# Patient Record
Sex: Male | Born: 1970 | Race: Black or African American | Hispanic: No | Marital: Married | Smoking: Former smoker
Health system: Southern US, Community
[De-identification: ages and names within clinical notes are randomized; demographics above are authoritative.]

## PROBLEM LIST (undated history)

## (undated) ENCOUNTER — Ambulatory Visit (HOSPITAL_COMMUNITY): Admission: EM | Payer: Self-pay | Source: Home / Self Care

## (undated) DIAGNOSIS — I82409 Acute embolism and thrombosis of unspecified deep veins of unspecified lower extremity: Secondary | ICD-10-CM

---

## 2008-02-11 ENCOUNTER — Ambulatory Visit: Payer: Self-pay | Admitting: Internal Medicine

## 2008-02-11 ENCOUNTER — Ambulatory Visit: Payer: Self-pay | Admitting: Vascular Surgery

## 2008-02-11 ENCOUNTER — Inpatient Hospital Stay (HOSPITAL_COMMUNITY): Admission: EM | Admit: 2008-02-11 | Discharge: 2008-02-17 | Payer: Self-pay | Admitting: Emergency Medicine

## 2008-02-18 ENCOUNTER — Encounter: Payer: Self-pay | Admitting: Sports Medicine

## 2008-02-18 ENCOUNTER — Ambulatory Visit (HOSPITAL_COMMUNITY): Admission: RE | Admit: 2008-02-18 | Discharge: 2008-02-18 | Payer: Self-pay | Admitting: Sports Medicine

## 2008-02-18 ENCOUNTER — Ambulatory Visit: Payer: Self-pay | Admitting: Family Medicine

## 2008-02-18 DIAGNOSIS — I2699 Other pulmonary embolism without acute cor pulmonale: Secondary | ICD-10-CM

## 2008-02-18 DIAGNOSIS — I82419 Acute embolism and thrombosis of unspecified femoral vein: Secondary | ICD-10-CM | POA: Insufficient documentation

## 2008-02-18 DIAGNOSIS — I82409 Acute embolism and thrombosis of unspecified deep veins of unspecified lower extremity: Secondary | ICD-10-CM | POA: Insufficient documentation

## 2008-02-18 HISTORY — DX: Acute embolism and thrombosis of unspecified deep veins of unspecified lower extremity: I82.409

## 2008-02-18 HISTORY — DX: Other pulmonary embolism without acute cor pulmonale: I26.99

## 2008-02-20 ENCOUNTER — Telehealth: Payer: Self-pay | Admitting: Family Medicine

## 2008-02-20 ENCOUNTER — Emergency Department (HOSPITAL_COMMUNITY): Admission: EM | Admit: 2008-02-20 | Discharge: 2008-02-20 | Payer: Self-pay | Admitting: Emergency Medicine

## 2008-02-24 ENCOUNTER — Telehealth: Payer: Self-pay | Admitting: *Deleted

## 2008-02-24 ENCOUNTER — Ambulatory Visit: Payer: Self-pay | Admitting: Family Medicine

## 2008-02-24 DIAGNOSIS — M79609 Pain in unspecified limb: Secondary | ICD-10-CM

## 2008-02-24 DIAGNOSIS — G8929 Other chronic pain: Secondary | ICD-10-CM | POA: Insufficient documentation

## 2008-02-27 ENCOUNTER — Ambulatory Visit: Payer: Self-pay | Admitting: Family Medicine

## 2008-02-27 LAB — CONVERTED CEMR LAB: INR: 1.8

## 2008-03-08 ENCOUNTER — Ambulatory Visit: Payer: Self-pay | Admitting: Family Medicine

## 2008-03-23 ENCOUNTER — Encounter: Payer: Self-pay | Admitting: Family Medicine

## 2008-03-26 ENCOUNTER — Telehealth: Payer: Self-pay | Admitting: Family Medicine

## 2008-03-30 ENCOUNTER — Telehealth: Payer: Self-pay | Admitting: *Deleted

## 2008-03-31 ENCOUNTER — Ambulatory Visit: Payer: Self-pay | Admitting: Family Medicine

## 2008-03-31 LAB — CONVERTED CEMR LAB: INR: 1.5

## 2008-04-14 ENCOUNTER — Encounter: Payer: Self-pay | Admitting: Family Medicine

## 2008-04-30 ENCOUNTER — Encounter: Payer: Self-pay | Admitting: Family Medicine

## 2008-05-12 ENCOUNTER — Observation Stay (HOSPITAL_COMMUNITY): Admission: AD | Admit: 2008-05-12 | Discharge: 2008-05-13 | Payer: Self-pay | Admitting: Family Medicine

## 2008-05-12 ENCOUNTER — Encounter: Payer: Self-pay | Admitting: Emergency Medicine

## 2008-05-12 ENCOUNTER — Ambulatory Visit: Payer: Self-pay | Admitting: Family Medicine

## 2008-05-12 ENCOUNTER — Encounter: Payer: Self-pay | Admitting: *Deleted

## 2008-05-12 DIAGNOSIS — R079 Chest pain, unspecified: Secondary | ICD-10-CM | POA: Insufficient documentation

## 2008-05-12 DIAGNOSIS — Z87891 Personal history of nicotine dependence: Secondary | ICD-10-CM

## 2008-05-12 DIAGNOSIS — G8190 Hemiplegia, unspecified affecting unspecified side: Secondary | ICD-10-CM | POA: Insufficient documentation

## 2008-05-12 DIAGNOSIS — R531 Weakness: Secondary | ICD-10-CM | POA: Insufficient documentation

## 2008-05-12 DIAGNOSIS — F1010 Alcohol abuse, uncomplicated: Secondary | ICD-10-CM | POA: Insufficient documentation

## 2008-05-12 DIAGNOSIS — F17200 Nicotine dependence, unspecified, uncomplicated: Secondary | ICD-10-CM

## 2008-05-12 HISTORY — DX: Personal history of nicotine dependence: Z87.891

## 2008-05-22 ENCOUNTER — Ambulatory Visit: Payer: Self-pay | Admitting: Family Medicine

## 2008-05-22 ENCOUNTER — Encounter: Payer: Self-pay | Admitting: Family Medicine

## 2008-05-22 ENCOUNTER — Inpatient Hospital Stay (HOSPITAL_COMMUNITY): Admission: EM | Admit: 2008-05-22 | Discharge: 2008-05-24 | Payer: Self-pay | Admitting: Emergency Medicine

## 2008-05-23 ENCOUNTER — Ambulatory Visit: Payer: Self-pay | Admitting: Psychiatry

## 2008-06-25 ENCOUNTER — Inpatient Hospital Stay (HOSPITAL_COMMUNITY): Admission: EM | Admit: 2008-06-25 | Discharge: 2008-06-28 | Payer: Self-pay | Admitting: *Deleted

## 2008-06-25 ENCOUNTER — Emergency Department (HOSPITAL_COMMUNITY): Admission: EM | Admit: 2008-06-25 | Discharge: 2008-06-25 | Payer: Self-pay | Admitting: Emergency Medicine

## 2008-06-25 ENCOUNTER — Ambulatory Visit: Payer: Self-pay | Admitting: *Deleted

## 2009-01-05 IMAGING — CT CT ANGIO CHEST
2 of 5 series · 19 of 36 positions shown · IV contrast (APPLIED)
Comparison: none

CLINICAL DATA: 36 year old with DVT right lower extremity.  Rule out pulmonary embolus.  
 CT ANGIOGRAPHY OF CHEST WITHOUT AND WITH CONTRAST:
TECHNIQUE: Multidetector CT imaging of the chest was performed before and during bolus injection of intravenous contrast.  Multiplanar CT angiographic image reconstructions were generated to evaluate the vascular anatomy.
 Contrast:  100 cc Omnipaque 350

[Series 8: pulm embolism 1.0 b25f thins · axial · 0.69mm/px · z∈[-304,-30]mm · 16 of 305 slices shown]
[im 16/305  lung]
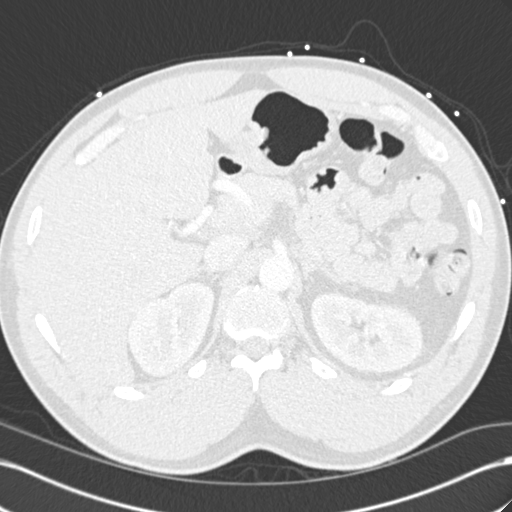
[im 31/305  mediastinal]
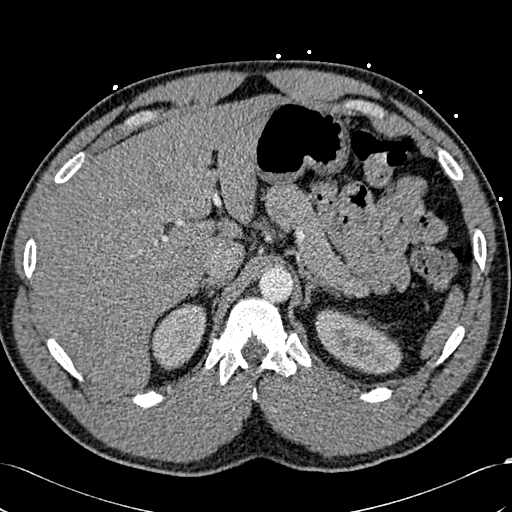
[im 46/305  lung]
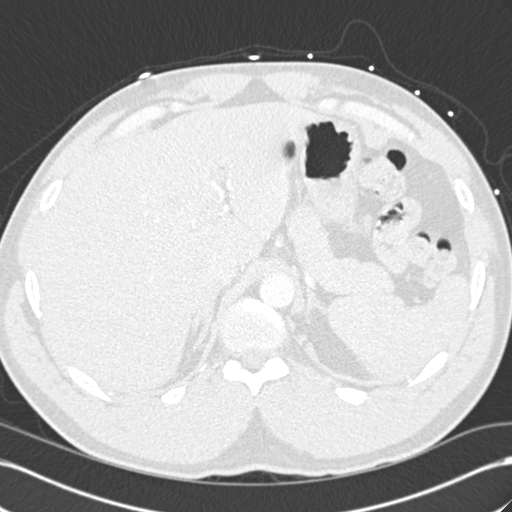
[im 77/305  mediastinal]
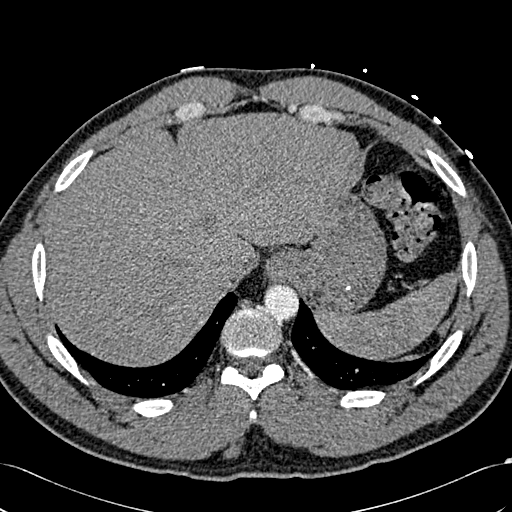
[im 92/305  lung]
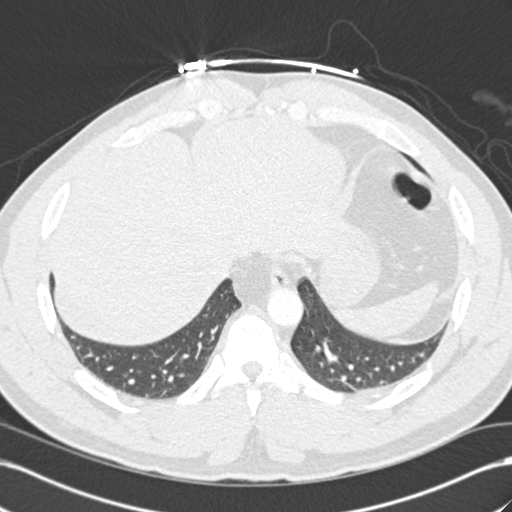
[im 107/305  mediastinal]
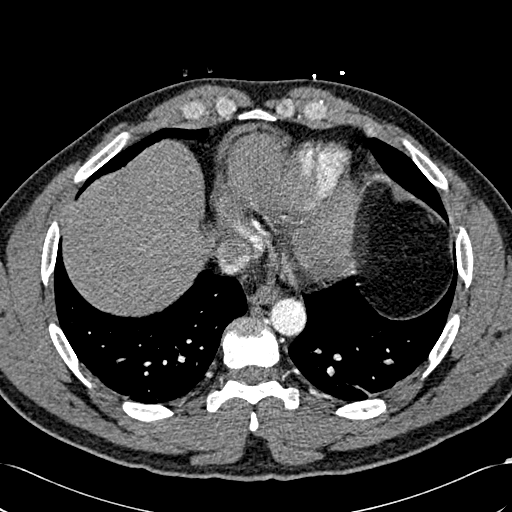
[im 122/305  lung]
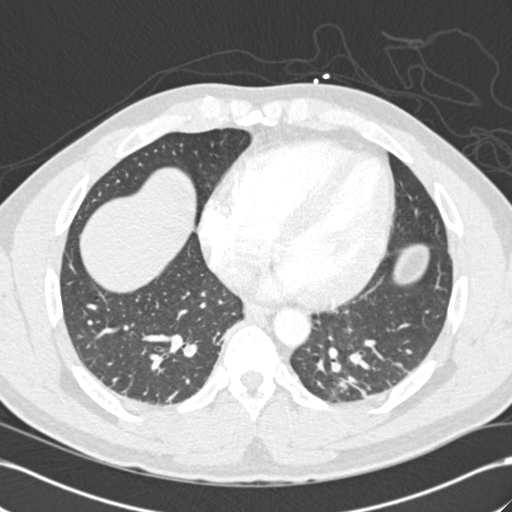
[im 137/305  mediastinal]
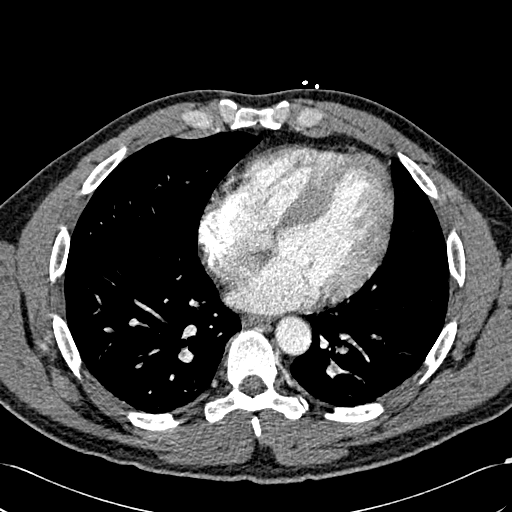
[im 168/305  lung]
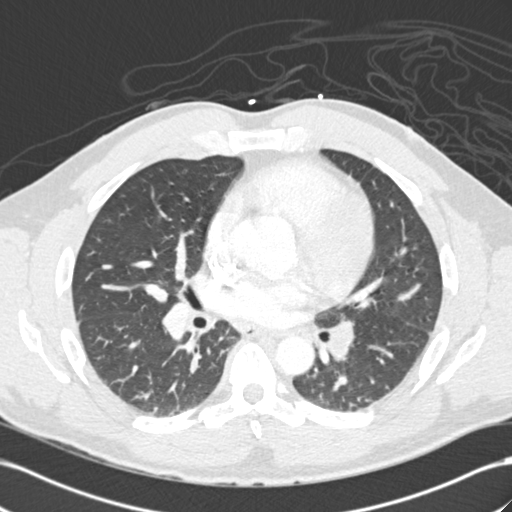
[im 183/305  mediastinal]
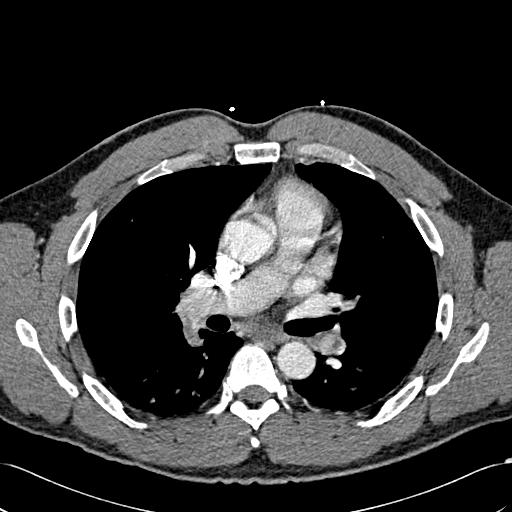
[im 198/305  lung]
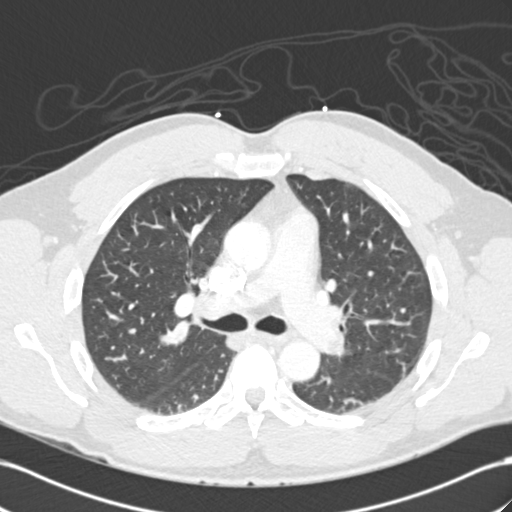
[im 213/305  mediastinal]
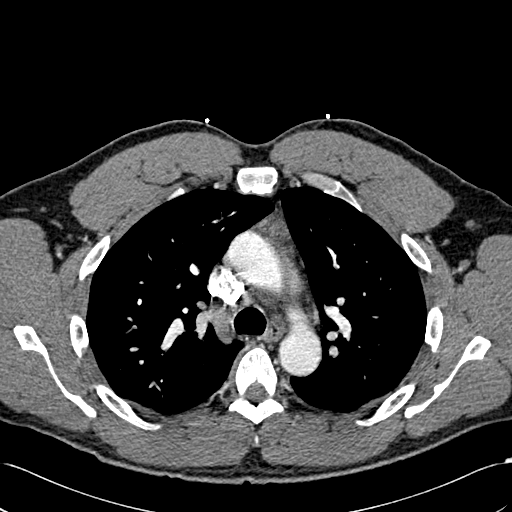
[im 229/305  lung]
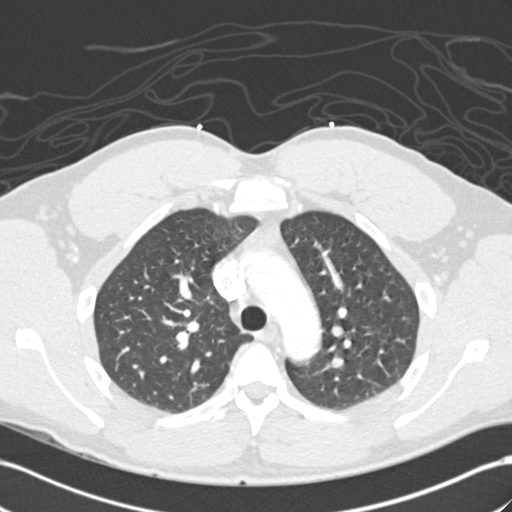
[im 259/305  mediastinal]
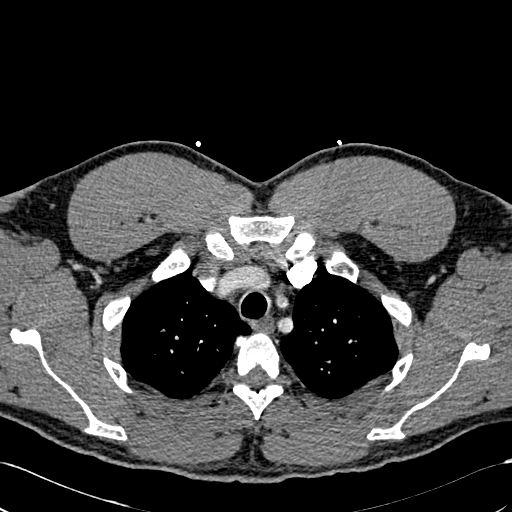
[im 274/305  lung]
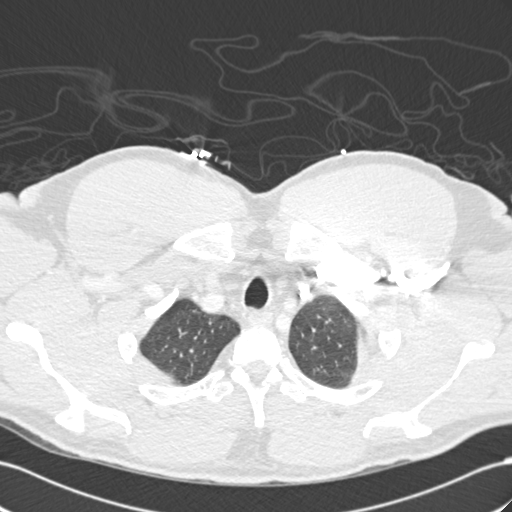
[im 289/305  mediastinal]
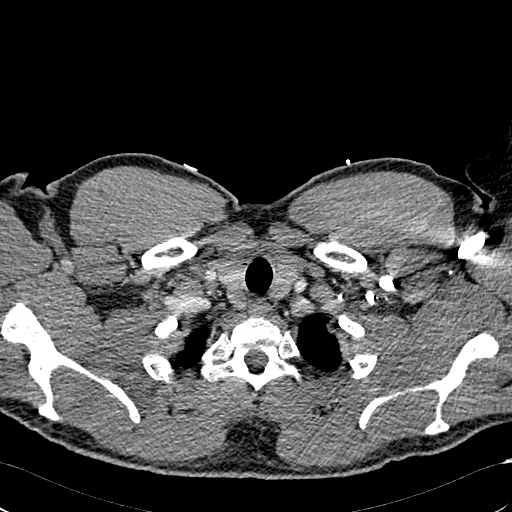

[Series 603: cor · coronal · 0.69mm/px · 3 of 128 slices shown]
[im 26/128  mediastinal]
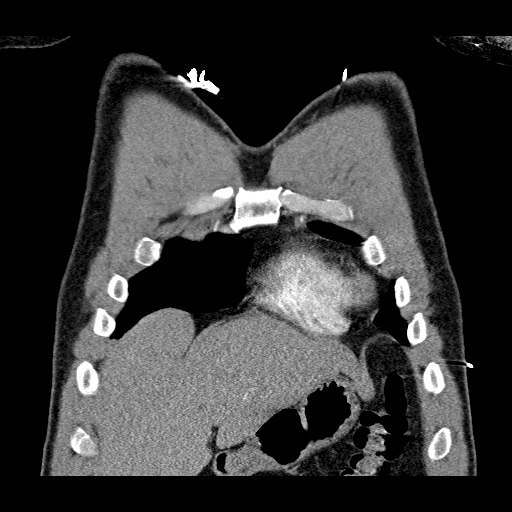
[im 51/128  mediastinal]
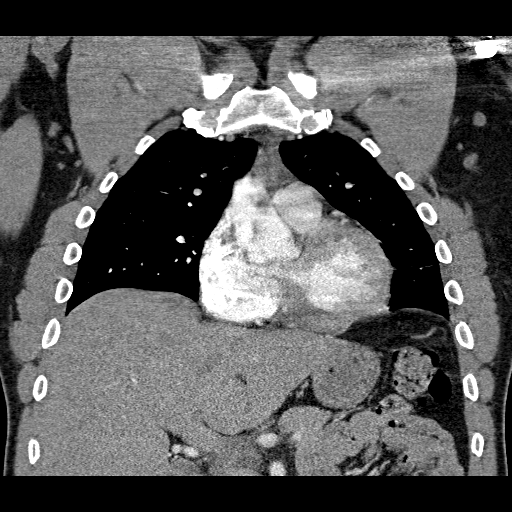
[im 77/128  mediastinal]
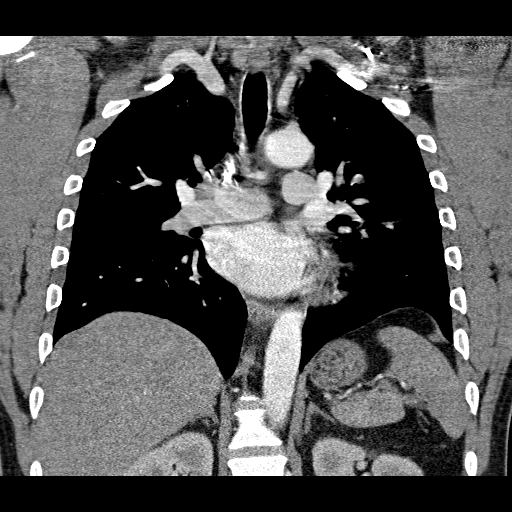

[19 of 36 positions shown; findings below may reference images not displayed]

FINDINGS: Study is slightly limited by the degree of inspiration at the time of exam.  However, there are bilateral pulmonary emboli.  These involve bilateral lower lobe and bilateral upper lobe branch vessels.  There is no mediastinal, hilar, or axillary adenopathy.  Lung windows show minimal dependent atelectasis.  Images of the upper abdomen are unremarkable.
IMPRESSION: Bilateral pulmonary emboli.  I discussed the findings with Dr. Wendel.

## 2010-01-22 ENCOUNTER — Ambulatory Visit: Payer: Self-pay | Admitting: Internal Medicine

## 2010-01-22 ENCOUNTER — Inpatient Hospital Stay (HOSPITAL_COMMUNITY): Admission: EM | Admit: 2010-01-22 | Discharge: 2010-01-26 | Payer: Self-pay | Admitting: Emergency Medicine

## 2010-01-25 ENCOUNTER — Encounter: Payer: Self-pay | Admitting: Pulmonary Disease

## 2010-01-25 ENCOUNTER — Ambulatory Visit: Payer: Self-pay | Admitting: Vascular Surgery

## 2010-12-16 IMAGING — CR DG CHEST 1V PORT
1 series · 1 of 1 positions shown · non-contrast
Comparison: Chest radiograph dated 01/22/2010

CLINICAL DATA: Metabolic acidosis and respiratory distress.

PORTABLE CHEST - 1 VIEW

[AP]
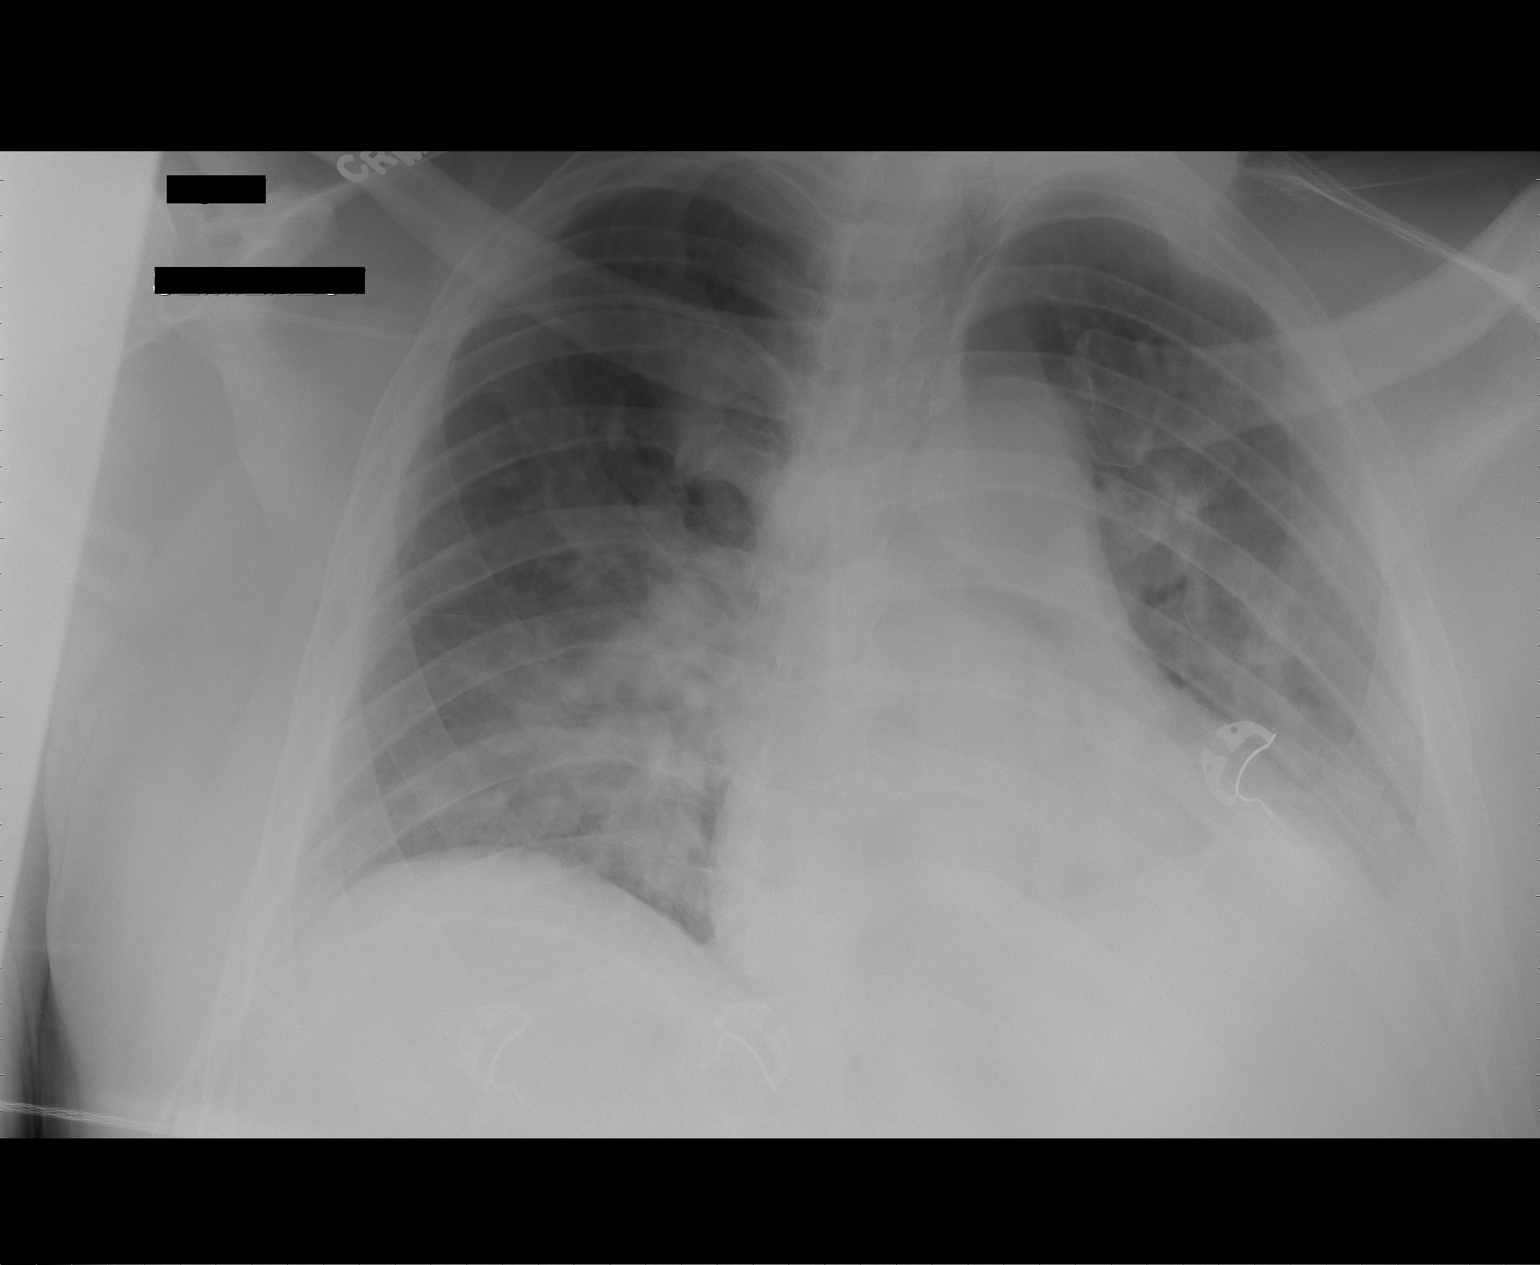

[1 of 1 positions shown; findings below may reference images not displayed]

FINDINGS: Portable semi upright view of the chest was obtained.
There are enlarged central vascular markings with haziness in the
left lung base.  Findings are suspicious for left pleural fluid
atelectasis.  Cardiac silhouette is grossly stable.  There is a
right PICC line overlying the SVC.
IMPRESSION: Slightly increased densities in the left lung base which may be
accentuated by patient rotation.  Findings are suggestive for
pleural fluid and/or atelectasis.

Vascular congestion versus mild edema.

## 2011-01-23 NOTE — Consult Note (Signed)
Summary: [facility]   Imported By: Roderic Ovens 01/27/2010 13:14:09  _____________________________________________________________________  External Attachment:    Type:   Image     Comment:   External Document

## 2011-03-11 LAB — BASIC METABOLIC PANEL
BUN: 9 mg/dL (ref 6–23)
CO2: 23 mEq/L (ref 19–32)
CO2: 28 mEq/L (ref 19–32)
Calcium: 6.7 mg/dL — ABNORMAL LOW (ref 8.4–10.5)
Calcium: 6.9 mg/dL — ABNORMAL LOW (ref 8.4–10.5)
Chloride: 110 mEq/L (ref 96–112)
Creatinine, Ser: 2.06 mg/dL — ABNORMAL HIGH (ref 0.4–1.5)
GFR calc Af Amer: 42 mL/min — ABNORMAL LOW (ref >60)
GFR calc Af Amer: 44 mL/min — ABNORMAL LOW (ref >60)
GFR calc non Af Amer: 36 mL/min — ABNORMAL LOW (ref >60)
GFR calc non Af Amer: 39 mL/min — ABNORMAL LOW (ref >60)
Glucose, Bld: 132 mg/dL — ABNORMAL HIGH (ref 70–99)
Potassium: 3.3 mEq/L — ABNORMAL LOW (ref 3.5–5.1)
Sodium: 140 mEq/L (ref 135–145)
Sodium: 142 mEq/L (ref 135–145)

## 2011-03-11 LAB — URINE DRUGS OF ABUSE SCREEN W ALC, ROUTINE (REF LAB)
Amphetamine Screen, Ur: NEGATIVE
Benzodiazepines.: NEGATIVE
Ethyl Alcohol: <10 mg/dL (ref <10)
Opiate Screen, Urine: NEGATIVE
Phencyclidine (PCP): NEGATIVE
Propoxyphene: NEGATIVE

## 2011-03-11 LAB — DIFFERENTIAL
Lymphocytes Relative: 23 % (ref 12–46)
Monocytes Absolute: 2.1 10*3/uL — ABNORMAL HIGH (ref 0.1–1.0)
Monocytes Relative: 12 % (ref 3–12)
Neutro Abs: 11.4 10*3/uL — ABNORMAL HIGH (ref 1.7–7.7)

## 2011-03-11 LAB — GLUCOSE, CAPILLARY
Glucose-Capillary: 143 mg/dL — ABNORMAL HIGH (ref 70–99)
Glucose-Capillary: 266 mg/dL — ABNORMAL HIGH (ref 70–99)

## 2011-03-11 LAB — CARBOXYHEMOGLOBIN: Total hemoglobin: 13.3 g/dL — ABNORMAL LOW (ref 13.5–18.0)

## 2011-03-11 LAB — URINALYSIS, ROUTINE W REFLEX MICROSCOPIC
Bilirubin Urine: NEGATIVE
Glucose, UA: NEGATIVE mg/dL
Ketones, ur: NEGATIVE mg/dL
Leukocytes, UA: NEGATIVE
pH: 5.5 (ref 5.0–8.0)

## 2011-03-11 LAB — POCT CARDIAC MARKERS

## 2011-03-11 LAB — CULTURE, BLOOD (ROUTINE X 2): Culture: NO GROWTH

## 2011-03-11 LAB — CBC
HCT: 40.2 % (ref 39.0–52.0)
Hemoglobin: 13.5 g/dL (ref 13.0–17.0)
MCHC: 33.5 g/dL (ref 30.0–36.0)
MCV: 88.5 fL (ref 78.0–100.0)
Platelets: 241 10*3/uL (ref 150–400)
RBC: 4.55 MIL/uL (ref 4.22–5.81)
WBC: 17.7 10*3/uL — ABNORMAL HIGH (ref 4.0–10.5)

## 2011-03-11 LAB — AMYLASE: Amylase: 132 U/L — ABNORMAL HIGH (ref 0–105)

## 2011-03-11 LAB — HEPATIC FUNCTION PANEL
ALT: 25 U/L (ref 0–53)
AST: 228 U/L — ABNORMAL HIGH (ref 0–37)
AST: 69 U/L — ABNORMAL HIGH (ref 0–37)
Albumin: 3.1 g/dL — ABNORMAL LOW (ref 3.5–5.2)
Albumin: 3.5 g/dL (ref 3.5–5.2)
Total Bilirubin: 0.2 mg/dL — ABNORMAL LOW (ref 0.3–1.2)
Total Protein: 5.5 g/dL — ABNORMAL LOW (ref 6.0–8.3)

## 2011-03-11 LAB — TYPE AND SCREEN

## 2011-03-11 LAB — POCT I-STAT 3, ART BLOOD GAS (G3+)
Acid-base deficit: 7 mmol/L — ABNORMAL HIGH (ref 0.0–2.0)
Bicarbonate: 19.2 mEq/L — ABNORMAL LOW (ref 20.0–24.0)
O2 Saturation: 97 %
Patient temperature: 98.6
TCO2: 13 mmol/L (ref 0–100)
pCO2 arterial: 33.8 mmHg — ABNORMAL LOW (ref 35.0–45.0)
pH, Arterial: 7.15 — CL (ref 7.350–7.450)
pO2, Arterial: 98 mmHg (ref 80.0–100.0)

## 2011-03-11 LAB — CARDIAC PANEL(CRET KIN+CKTOT+MB+TROPI)
CK, MB: 111.8 ng/mL (ref 0.3–4.0)
CK, MB: 129.7 ng/mL (ref 0.3–4.0)
Relative Index: 0.3 (ref 0.0–2.5)
Relative Index: 0.8 (ref 0.0–2.5)
Total CK: 24399 U/L — ABNORMAL HIGH (ref 7–232)

## 2011-03-11 LAB — CK TOTAL AND CKMB (NOT AT ARMC)
CK, MB: 131.5 ng/mL (ref 0.3–4.0)
Relative Index: 0.8 (ref 0.0–2.5)
Total CK: 17217 U/L — ABNORMAL HIGH (ref 7–232)

## 2011-03-11 LAB — LACTIC ACID, PLASMA
Lactic Acid, Venous: 10.9 mmol/L — ABNORMAL HIGH (ref 0.5–2.2)
Lactic Acid, Venous: 4.8 mmol/L — ABNORMAL HIGH (ref 0.5–2.2)

## 2011-03-11 LAB — PROTIME-INR
INR: 1.58 — ABNORMAL HIGH (ref 0.00–1.49)
Prothrombin Time: 18.7 seconds — ABNORMAL HIGH (ref 11.6–15.2)

## 2011-03-11 LAB — LEGIONELLA ANTIGEN, URINE: Legionella Antigen, Urine: NEGATIVE

## 2011-03-11 LAB — COCAINE, URINE, CONFIRMATION: Benzoylecgonine GC/MS Conf: 49000 ng/mL

## 2011-03-11 LAB — SALICYLATE LEVEL
Salicylate Lvl: <4 mg/dL (ref 2.8–20.0)
Salicylate Lvl: <4 mg/dL (ref 2.8–20.0)

## 2011-03-11 LAB — URINE MICROSCOPIC-ADD ON

## 2011-03-11 LAB — ETHYLENE GLYCOL

## 2011-03-11 LAB — POCT I-STAT 3, VENOUS BLOOD GAS (G3P V)
TCO2: <5 mmol/L (ref 0–100)
pO2, Ven: 110 mmHg — ABNORMAL HIGH (ref 30.0–45.0)

## 2011-03-11 LAB — LIPASE, BLOOD: Lipase: 43 U/L (ref 11–59)

## 2011-03-11 LAB — URINE CULTURE: Colony Count: NO GROWTH

## 2011-03-14 LAB — COMPREHENSIVE METABOLIC PANEL
AST: 270 U/L — ABNORMAL HIGH (ref 0–37)
Alkaline Phosphatase: 30 U/L — ABNORMAL LOW (ref 39–117)
BUN: 8 mg/dL (ref 6–23)
CO2: 26 mEq/L (ref 19–32)
Chloride: 107 mEq/L (ref 96–112)
Creatinine, Ser: 1.61 mg/dL — ABNORMAL HIGH (ref 0.4–1.5)
GFR calc non Af Amer: 48 mL/min — ABNORMAL LOW (ref >60)
Potassium: 3.7 mEq/L (ref 3.5–5.1)
Total Bilirubin: 1 mg/dL (ref 0.3–1.2)

## 2011-03-14 LAB — CBC
HCT: 36.2 % — ABNORMAL LOW (ref 39.0–52.0)
HCT: 40.1 % (ref 39.0–52.0)
Hemoglobin: 12.1 g/dL — ABNORMAL LOW (ref 13.0–17.0)
Hemoglobin: 13.1 g/dL (ref 13.0–17.0)
MCV: 88.8 fL (ref 78.0–100.0)
MCV: 89.1 fL (ref 78.0–100.0)
Platelets: 113 10*3/uL — ABNORMAL LOW (ref 150–400)
Platelets: 124 10*3/uL — ABNORMAL LOW (ref 150–400)
RBC: 4.52 MIL/uL (ref 4.22–5.81)
WBC: 6.1 10*3/uL (ref 4.0–10.5)
WBC: 8.9 10*3/uL (ref 4.0–10.5)

## 2011-03-14 LAB — BASIC METABOLIC PANEL
BUN: 9 mg/dL (ref 6–23)
Chloride: 106 mEq/L (ref 96–112)
GFR calc non Af Amer: 42 mL/min — ABNORMAL LOW (ref >60)
Potassium: 3 mEq/L — ABNORMAL LOW (ref 3.5–5.1)
Sodium: 140 mEq/L (ref 135–145)

## 2011-03-14 LAB — PROTIME-INR: INR: 1.15 (ref 0.00–1.49)

## 2011-05-08 NOTE — H&P (Signed)
NAME:  Dean Shelton, Dean Shelton NO.:  [id number]   MEDICAL RECORD NO.:  [id number]          PATIENT TYPE:  INP   LOCATION:  4735                         FACILITY:  [facility]   PHYSICIAN:  Pearlean Brownie, M.D.DATE OF BIRTH:  June 04, 1971   DATE OF ADMISSION:  05/12/2008  DATE OF DISCHARGE:                              HISTORY & PHYSICAL   CHIEF COMPLAINT:  Chest pain and left arm weakness.   HISTORY OF PRESENT ILLNESS:  The patient is a 40 year old African  American male with history of right lower extremity DVT and bilateral PE  in February 2009.  He was resting, trying to fall asleep at about 12:30  this morning when he developed a left-sided chest pain described as  sharp and squeezing, worsened with positional movement such as bending.  The pain will come and go, but kept increasing in both frequency,  severity, and duration.  He also began developing some shortness of  breath and diaphoresis.  The chest pain did not radiate anywhere, but he  also began developing left arm numbness, tingling, and weakness, which  were gradually improving.  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  The blurry vision was significant to the point  that he had to pull off the road, but his vision returned to normal and  has been normal since that time.   As far as his history of DVT and PE, his initial DVT and PE were  unprovoked.  He has been on Coumadin since February 2009, although he  has missed multiple lab visits.  He states that he had been out of his  Coumadin 3-4 days ago.  He denies any current leg swelling or pain.   He does note increasing stress and anxiety over the past few weeks as he  recently lost his job.  He started smoking.  He denies drug use.  He is  drinking a pint of alcohol every 2-3 days.  Last drink, 2 days ago.   PAST MEDICAL HISTORY:  Right lower extremity DVT as  well as bilateral PE  in February 2009.   CURRENT MEDICATIONS:  Coumadin.   ALLERGIES:  No known drug allergies.   FAMILY HISTORY:  Mother with coronary artery disease and stroke.  He  states her first heart attack was in early 34s to early 53s.  He has a  brother who died of coronary artery disease at age 72.   SOCIAL HISTORY:  He is married with 2 young children.  He is currently  unemployed.  He was laid off 2 weeks ago.  High school graduate.  He has  been off and on smoker, one-pack per day since age 40.  He is currently  smoking about a pack a day.  He does drink a pint of alcohol every 2-3  days, most recent was 2 days ago.  He denies any illicit drug use.   REVIEW OF SYSTEMS:  No speech changes or difficulty swallowing.  Denies  any palpitations or edema.  No cough, hemoptysis, or recent illnesses.  Admits without any melena or hematochezia.  He did vomit once in the  emergency department.   PHYSICAL EXAMINATION:  Temperature 97.8, pulse 92, blood pressure  138/82, respiratory rate 16, and O2 sat 99% on 2 L.  GENERAL:  He is somewhat drowsy, but is awake and is oriented x3.  HEENT:  Normocephalic and atraumatic.  Pupils are equally round and  reactive to light and accommodation.  Extraocular motion intact.  He has  good dentition.  Moist mucous membranes.  NECK:  Supple with full range of motion.  He has some mild tenderness on  the left side of his neck.  CHEST:  Chest wall is nontender.  LUNGS:  He has poor respiratory effort.  Breath sounds are somewhat  decreased in lower lobes bilaterally, but no wheezes, rales, or rhonchi.  HEART:  Normal S1 and S2.  No murmurs, rubs, or gallops.  ABDOMEN:  Positive bowel sounds, soft, nontender, and nondistended.  EXTREMITIES:  No edema.  Calves are nontender.  Negative Homans sign.  NEURO Exam:  Drowsy, but oriented x3.  Cranial nerves II through XII are  grossly intact.  Sensation intact to light touch.  Deep tendon reflexes   are 2+ symmetric and normal throughout.  Finger-to-nose is normal.  Romberg is negative.  He has no pronator drift.  He does have weakness  with shoulder abduction as well as elbow flexion, extension, and grip  strength in his left arm.  His remainder of muscle strength is 5/5  throughout.   LABORATORY DATA:  White blood cell count 10.1, hemoglobin 15.4, and  platelet count 204.  Sodium 141, potassium 3.9, creatinine 1.5, and  glucose 87.  PT 13.4, INR 1.0, and PTT 29.  Point-of-care enzymes are  negative x1.   IMAGES:  1. Left shoulder x-ray negative for fracture dislocation.  2. Chest x-ray shows no pulmonary edema, patchy right basilar      atelectasis, and left basilar atelectasis.  3. Chest CT angiogram shows no evidence of PE or infiltrate.  4. CT of head shows no acute intracranial abnormality, left maxillary      sinus, mucous retention cyst, or polyp.  5. EKG, I do not have the EKG with me right now, but per ER physician,      it was normal.  I will follow up on this.   ASSESSMENT/PLAN:  A 40 year old African American male with history of  bilateral pulmonary embolism as well as deep vein thrombosis, now with  new onset of chest pain as well as left arm weakness.  1. Chest pain.  Atypical chest pain, but with significant family      history and personal history of tobacco abuse.  No evidence of      pulmonary embolism or infection on CT angiogram.  No history of      gastroesophageal reflux disease.  Recently worsening stress at      home.  We will admit to telemetry, cycle cardiac enzymes, risk      stratify.  We will check urine drug screen.  We will treat possible      gastrointestinal etiology with Protonix.  Continue aspirin 324 mg      daily.  2. Left arm weakness.  Concern for possible transient ischemic attack      versus cerebrovascular accident.  He is mostly very drowsy, so      question of effort related.  Only left arm weakness, no other  neurologic  deficits.  Head CT is negative for hemorrhagic stroke,      and he is outside limit of TPA, so at this point we will continue      aspirin to risk stratify.  We will check MRI to rule out      cerebrovascular accident.  If positive, we will need 2-D echo and      carotid Dopplers.  3. History of pulmonary embolism.  No evidence of current pulmonary      embolism or deep vein thrombosis.  We will restart Coumadin.      Continue Lovenox until therapeutic.  Remind the patient on      importance of staying on this medication.  His prior deep vein      thrombosis and pulmonary embolism were thus discussed with the      primary care physician about hypercoagulable workup.  Lupus      anticoagulant has been positive in the past and      we may need to repeat this.  4. Tobacco abuse.  He is advised to quit.  We will obtain smoking      cessation consult.  5. Alcohol use.  Denies withdrawal.  We will check alcohol level given      drowsiness and monitor vital signs.      Benn Moulder, M.D.  Electronically Signed      Pearlean Brownie, M.D.  Electronically Signed    MR/MEDQ  D:  05/12/2008  T:  05/13/2008  Job:  [id number]

## 2011-05-08 NOTE — H&P (Signed)
NAME:  Dean Shelton, Dean Shelton NO.:  [id number]   MEDICAL RECORD NO.:  [id number]          PATIENT TYPE:  IPS   LOCATION:  0605                          [facility]ACILITY[facility]   PHYSICIAN:  Jasmine Pang, M.D. DATE OF BIRTH:  01-05-1971   DATE OF ADMISSION:  06/26/2008  DATE OF DISCHARGE:                       PSYCHIATRIC ADMISSION ASSESSMENT   HISTORY:  This is a 40 year old separated African American male.  His  sister called 911 and the police brought him to the [facility]  His commitment papers indicate that he was severely  depressed.  He was felt to be suicidal.  His mother died in 01/01/2008 from Alzheimer's.  He attempted suicide a month ago by overdosing  on his Coumadin and liquor.  His wife left him several days ago.  He has  not been sleeping.  He took alcohol and NyQuil to help him sleep.  Apparently, he initially said he was doing this to try to sleep, but he  did tell his sister he wanted to die.  Hence, commitment papers were  taken out.  Back in February, he was admitted to [facility].  He was noted to have a right lower extremity DVT with pulmonary  embolisms bilaterally, epididymitis.  He was discharged on Coumadin 5 mg  p.o. daily.  On May 22, 2008, he was admitted to the hospital for  abusing alcohol.  He was to be seen by psychiatry, however, he was not  in the room when the psychiatrist came.  Although he had agreed to come  to have some inpatient care, he eloped from the hospital and did not  return until today.   PAST PSYCHIATRIC HISTORY:  He does not have any.   SOCIAL HISTORY:  He has a GED.  He has been married once.  He has 3 sons  age 2, 42 and 2.  He was last employed approximately a year ago.  He  says about this time last year, he moved to [facility] to help with his mom  who had become quite ill.  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  Unfortunately, his mom did pass in 01-01-24.  He  actually took her home and she died in his presence.  He walked away  from his business.  He states he just has not been able to get it  together since his mom passed.   ALCOHOL/DRUG HISTORY:  He stated he started abusing alcohol when his mom  got sick.   PRIMARY CARE Miciah Covelli:  He does not have one.  He has no prior  psychiatric history.   MEDICATIONS:  He is supposed to be taking Coumadin, but he is not.   ALLERGIES:  NO KNOWN DRUG ALLERGIES.   LABORATORY DATA:  His PTT was 35, the normal is 24-37.  His pro-time was  17.9, normal range is 11.6 to 15.2.  He will have his Coumadin monitored  by pharmacy as per protocol.   PHYSICAL EXAMINATION:  GENERAL:  He did not have any other worrisome  physical findings.  He had several tattoos.  Please see anatomic drawing  on admission for placement.  He has no evidence for recurrence of DVT at  this time.  VITAL SIGNS on admission to the unit:  He is 74 inches tall.  He weighs  248 pounds.  Temperature 97, blood pressure was 125/77 to 115/79, pulse  was 89, respirations are 22.   MENTAL STATUS EXAM:  He is alert and oriented.  He is appropriately  groomed, dressed and nourished.  His motor is normal.  He has good eye  contact.  His speech is a normal rate, rhythm and tone.  His mood is  depressed.  His affect is congruent.  Thought processes are clear,  rational and goal oriented.  He would like to get his former life back.  Judgment and insight are good.  Concentration and memory are good.  Intelligence is at least average.  He denies being suicidal or  homicidal.  He denies auditory or visual hallucinations.   DIAGNOSES:  AXIS I:  Depression, unresolved grief.  AXIS II:  Deferred.  AXIS III:  Right deep venous thrombosis with bilateral pulmonary  thrombosis, 209.  Noncompliant with anticoagulative therapy.  AXIS IV:  Problems with primary support group.  He has had issues  becoming employed due to an old felony drug charge.  AXIS  V:  30.   PLAN:  Admit for safety and stabilization.  We will start on Celexa as  maintenance of this medication post-discharge is a concern.  We will  start at 20 mg.  Pharmacy is restarting his Coumadin.  This was  explained to him that it is prevention at this point in time to prevent  a recurrence.  We will work on getting his INR and PTT therapeutic while  in the hospital.  We will schedule a family planning session with his  wife for discharge planning purposes.   Estimated length of stay is 4-5 days.      Mickie Leonarda Salon, P.A.-C.      Jasmine Pang, M.D.  Electronically Signed    MD/MEDQ  D:  06/26/2008  T:  06/26/2008  Job:  [id number]

## 2011-05-08 NOTE — Discharge Summary (Signed)
NAME:  Dean Shelton, Dean Shelton NO.:  [id number]   MEDICAL RECORD NO.:  [id number]          PATIENT TYPE:  INP   LOCATION:  2631                         FACILITY:  [facility]   PHYSICIAN:  Leighton Roach McDiarmid, M.D.DATE OF BIRTH:  Dec 10, 1971   DATE OF ADMISSION:  02/11/2008  DATE OF DISCHARGE:  02/17/2008                               DISCHARGE SUMMARY   PRIMARY CARE PHYSICIAN:  Marisue Ivan, M.D., [facility].   DISCHARGE DIAGNOSES:  1. Right lower extremity deep vein thrombosis.  2. Pulmonary embolism's bilateral.  3. Epididymitis.  4. History of tobacco use.   DISCHARGE MEDICATIONS:  1. Coumadin 5 mg p.o. daily.  2. Oxycodone 5 mg p.o. q.4 hours as needed for breakthrough pain.  3. Oxycontin 30 mg sustained release p.o. b.i.d. for pain.  4. Tylenol 1000 mg p.o. q.8 hours for pain.  5. Colace 100 mg p.o. b.i.d.  6. Ambien 5 mg p.o. q.h.s.   CONSULTATIONS:  None.   PROCEDURES:  1. The patient had CT angio on February 14, 2008 that showed bilateral      pulmonary embolisms.  2. The patient also had lower extremity Doppler's that showed      thrombosis of the common femoral, femoral profunda, popliteal,      posterior tibial, proximal greater saphenous.  3. The patient had a scrotal ultrasound on February 12, 2008 that      showed asymmetric size of the epididymal heads, right larger with      heterogeneous echocardiogram-texture, suspect a subacute/chronic      right epididymitis with small right hydrocele, likely reactive. No      mass or no torsion was seen.   LABORATORY DATA:  On admission, the patient had D-dimer of 11.88. INR of  1. White blood cell count 12.5. Hemoglobin 14.2. Platelets 228,000.  Sodium 137, potassium 4.1, and creatinine 1.6. The patient had anti-  phospholipid panel that was preliminary reading of negative. On date of  discharge, patient had sodium of 131, potassium 3.7, creatinine 1.22.  White blood cell count 7.5.  Hemoglobin 13.7. Platelets 237,000. INR of  2.7.   HOSPITAL COURSE:  This is a 40 year old African-American male that was  admitted for right leg swelling.   PROBLEM LIST:  1. DEEP VEIN THROMBOSIS:  When the patient was admitted from Urgent      Care, he underwent a lower extremity Doppler that was significant      for thrombosis of the common femoral, common profunda, common      popliteal, posterior tibial, proximal greater saphenous. The      patient was started on therapeutic Lovenox and Coumadin. The      patient was hemodynamically stable throughout his hospital stay but      complained of severe pain and was placed on IV morphine at times      and was weaned off of IV morphine to p.o. Oxycontin and Oxycodone      for breakthrough pain. The patient underwent a CT angiogram that      showed bilateral pulmonary embolisms. The patient never underwent  thrombolytics because he was hemodynamically stable. Did not convey      any desaturation of his O2 or complain of chest pain. On day of      discharge patient was therapeutic level of INR of 2.7 and was      discharged on Coumadin 5 mg daily. Plan to him is to followup with      Dr. Marisue Ivan, his primary care physician, and to have a      clinical evaluation tomorrow, February 18, 2008 and also check his      INR level. Plan will be to check his INR level for the next few      weeks on a weekly basis. He would need minimum of 6 months of      Coumadin.  2. EPIDIDYMITIS:  The patient complained of some groin pain and      underwent a scrotal ultrasound that was suspicious for      subacute/chronic epididymitis. The patient underwent a Gonorrhea      and Chlamydia culture that was negative. The patient had minimal      scrotal swelling. No signs of mass or torsion. Thought that this      might be related to the DVT and not an acute epididymitis, given      his normal white blood cell count and no dysuria and a normal UA.   3. TOBACCO USE:  The patient was educated and also advised on the risk      of smoking and DVT's and the risk of smoking on his health. He      received smoking cessation and has quit.   DISPOSITION:  He is now plugged in with the [facility].  Will followup on February 18, 2008 at 1:30 p.m.   DISCHARGE INSTRUCTIONS:  1. He was instructed to avoid foods that could interact with the      Coumadin.  2. He was also instructed that if he had chest pain or shortness of      breath and felt like he was decompensating in any way, he should      return to the emergency department as soon as possible.  3. He will have compression stockings on the right leg and have thigh      high.  4. He will also need a walker for this period of time.  5. Will need to followup with INR tomorrow.   CONDITION ON DISCHARGE:  Stable condition.      Marisue Ivan, MD  Electronically Signed      Leighton Roach McDiarmid, M.D.  Electronically Signed    KL/MEDQ  D:  02/17/2008  T:  02/17/2008  Job:  [id number]   cc:   Marisue Ivan, MD

## 2011-05-08 NOTE — Discharge Summary (Signed)
NAME:  Dean Shelton, Dean Shelton NO.:  [id number]   MEDICAL RECORD NO.:  [id number]          PATIENT TYPE:  INP   LOCATION:  4735                         FACILITY:  [facility]   PHYSICIAN:  Pearlean Brownie, M.D.DATE OF BIRTH:  [birth year]   DATE OF ADMISSION:  05/12/2008  DATE OF DISCHARGE:  05/13/2008                               DISCHARGE SUMMARY   DISCHARGE DIAGNOSES:  1. Atypical chest pain noncardiac.  2. Deep vein thrombosis prophylaxis treatment.  3. Alcohol abuse.  4. Cocaine abuse.   DISCHARGE MEDICATIONS:  Coumadin 5 mg p.o. daily.   CONSULTATIONS:  None.   PROCEDURE:  None.   LABORATORY DATA:  Upon admission, the patient had a head CT that was  normal, a chest CT that showed no PE.  White blood cell count of 10.1,  hemoglobin 50.4, platelets 204, INR 1, C-MET within normal limits,  alcohol is less than 5.  HDL of 29, LDL of 99, cholesterol of 045,  triglycerides of 113.  The patient had a carotid enzymes that were  negative x3 on day of discharge.  INR was 1.2.  UDS showed positive  cocaine.  MRI of the brain was normal, negative for hemorrhage.  EKG was  normal, normal sinus rhythm.  LFTs were all within normal limits.   BRIEF HOSPITAL COURSE:  This is a 40 year old male with history of PEs  and DVT radiopathic pain that was admitted for new atypical chest pain  and left arm weakness.  1. Chest pain.  The patient was ruled out for cardiac etiology.  The      patient had a normal EKG.  Cardiac enzymes were negative x3.      Symptoms resolved rapidly and did not return.  The patient was      restratified with a lipid panel, did show a low HDL.  LDL was      within normal range.  The patient had a CT of the chest to rule out      PE which showed resolved former PE from February 2009.  The patient      was also noted to have a positive cocaine in his urine.  Uncertain      of this was the related cause.  As right now, it was considered      noncardiac in  nature.  He can be followed up as an outpatient.  The      patient's blood pressure is under control.  No signs of diabetes.      The patient is occasionally a smoker and has been counseled on this      as a risk factor.  2. Left arm weakness.  The patient was ruled out for TIA or CIA.  The      patient has a MRI that was normal.  Given his subtherapeutic INR,      he was at risk for possible clots, but this was being negative.      Right now, there is no source of etiology to his left arm weakness.      Most likely diagnosis intoxication.  3. History of DVTs.  The patient has been on Coumadin in the past, but      has failed to show up to many lab appointments.  He was initially      on 5 mg except for Friday when he was on 2.5 mg when he arrives      into the hospital.  On this admission, his INR was noted to be 1.      He received Lovenox and Coumadin during this hospital stay.  His      INR bumped from 1-1.2 upon discharge.  He will be discharged on      Coumadin 5 mg, and will recheck his INR on Tuesday and will redose      his medication from there with closed followup.  4. Drug abuse.  This is the first time that he has been noted to be      cocaine positive.  He has never had a problem with drugs in the      past.  I suspect that it is something due with his social      situation.  He was reluctant to talk about.  At this time, I      explained to him the risk factors of cocaine and chest pain.  Also,      I counseled him on the risk factors of not taking his Coumadin, and      I explained him that if he continued to be noncompliant with his      Coumadin that I can no longer be his physician and prescribe this      medication, because it is such a high risk of medication.  He seems      to understand this.   DISPOSITION:  He is in stable condition and is prepared for discharge  and will follow up.  He will have followup with me Dr. Burnadette Pop in 1-2  weeks, and he has been  advised to arrange an appointment.  He will be  seen in the clinic, in lab on Tuesday for INR check.      Marisue Ivan, MD  Electronically Signed      Pearlean Brownie, M.D.  Electronically Signed    KL/MEDQ  D:  05/13/2008  T:  05/14/2008  Job:  [id number]

## 2011-05-08 NOTE — H&P (Signed)
NAME:  Dean Shelton, Dean Shelton NO.:  [id number]   MEDICAL RECORD NO.:  [id number]          PATIENT TYPE:  INP   LOCATION:  2631                         FACILITY:  [facility]   PHYSICIAN:  Leighton Roach McDiarmid, M.D.DATE OF BIRTH:  1971-03-01   DATE OF ADMISSION:  02/11/2008  DATE OF DISCHARGE:                              HISTORY & PHYSICAL   PRIMARY CARE PHYSICIAN:  None.  The patient is admitted from [facility].   CHIEF COMPLAINT:  Leg pain and swelling.   HISTORY OF PRESENT ILLNESS:  Mr. Dean Shelton is a 40 year old African-American  male who presented to [facility] today with a 3 day  history of progressive leg pain and swelling.  It started Monday in his  right upper leg/lower abdomen.  Yesterday, it extended to the groin  area.  Today, the pain was extending below his knee.  He has had  progressive swelling that has coincided with the evolution of this pain.  His right leg is visibly larger than his left.  He was sent here to [facility]  ED for lower extremity Dopplers for suspected DVT.   REVIEW OF SYSTEMS:  Positive for dyspnea with pain.  Mostly today, he  had some pain with movement that was making him short of breath.  Negative for chest pain or abdominal pain.  He has had night sweats over  the same last 3 days.   PAST MEDICAL HISTORY:  None.   MEDICATIONS:  None.   FAMILY HISTORY:  Mother had coronary artery disease with stroke and  valve replacements.  There are no known clotting disorders in the  family.   SOCIAL HISTORY:  He is married with 2 small children at home.  No recent  travel.  No recent surgery or any other period of prolonged  immobilization.  He is a cigarette smoker with variable intake, but  always less than 1 pack per day.  He has quit twice, both for a 5 year  period.  Starting at the age of 63, but he has been smoking for the last  3 years.  He denies any alcohol or drugs.   ALLERGIES:  NONE.   PHYSICAL EXAMINATION:  VITAL  SIGNS:  Temperature 98.3, pulse 101,  respirations 20, blood pressure 125/77, satting 96% on room air.  GENERAL:  He is awake, alert and oriented in no acute distress.  He has  just received 2 mg of Dilaudid in the ED.  HEENT:  Extraocular movements are intact.  Pupils are equal, round, and  reactive to light.  Normocephalic, atraumatic with a clear oropharynx  and moist mucous membranes.  CARDIAC:  Regular rate and rhythm without murmur.  LUNGS:  Clear to auscultation bilaterally.  ABDOMEN:  Soft, nontender and nondistended with positive bowel sounds.  EXTREMITIES:  Showed his right leg to be greater in size than his left  leg all the way up to the groin.  Right leg is tense with cords palpable  over the medial surface of the upper calf with no SI.  He does have 2+  dorsalis pedis pulses  bilaterally.  Both feet are warm and well  perfused.   DIAGNOSTICS:  Lower extremity venous Doppler was obtained which was  positive for extensive DVT of the entire right lower extremity.   ASSESSMENT/PLAN:  The patient is a 40 year old otherwise healthy male  with a deep venous thrombosis.  1. Regarding his deep venous thrombosis, we will place the patient on      bedrest and we will start treatment dosed Lovenox 1 mg/kg b.i.d.      He did receive Dilaudid for pain in the ED with good response and      we will continue this.  The patient has excellent peripheral vein      perfusion.  We will not consult surgery tonight.  We will discuss a      hypercoagulability workup with the team in the morning including      antiphospholipid antibody, protein C and protein S.  We will start      Coumadin.  He will need to be on this for at least 6-12 months.  He      is showing no signs or symptoms of PE currently, but if present,      this would not change our management anyway unless he became      respiratory compromised, he would need intubation.  2. Fluids, electrolytes, nutrition and gastrointestinal.  The  patient      appears to be euvolemic.  Start on Nipride and fluids.  We will      give a regular diet.  3. Tobacco abuse.  The patient was counseled to quit and offered a      nicotine patch if the patient desires.   DISPOSITION:  Pending possible anticoagulation workup and arrangement of  home Lovenox.  We will get him therapeutic on Coumadin and document  stability of the clot.      Ardeen Garland, MD  Electronically Signed      Leighton Roach McDiarmid, M.D.  Electronically Signed    LM/MEDQ  D:  02/12/2008  T:  02/12/2008  Job:  [id number]

## 2011-05-08 NOTE — H&P (Signed)
NAME:  Dean Shelton, PILLSBURY NO.:  [id number]   MEDICAL RECORD NO.:  [id number]          PATIENT TYPE:  INP   LOCATION:  5008                         FACILITY:  [facility]   PHYSICIAN:  Nestor Ramp, MD        DATE OF BIRTH:  1971/04/01   DATE OF ADMISSION:  05/22/2008  DATE OF DISCHARGE:                              HISTORY & PHYSICAL   CHIEF COMPLAINT:  Suicide attempt and alcohol intoxication.   HISTORY OF PRESENT ILLNESS:  The patient is a 40 year old male who  presented to the emergency department acutely intoxicated after his wife  found him unconscious.  The patient arrived to ED.  He stated he did  not want to be here anymore and told the EDP he has taken 10 Coumadin.  The patient was waiting for his exam in the emergency department when he  ran into the ED parking lot and was brought back in handcuffs by  [facility] police.  The patient was involuntarily committed by the EDP  and the patient's wife is in agreement with this.  The patient denying  suicidal ideation.  When I spoke with him, he states that his wife  misunderstood his comments although she and the EDP are in agreement  that the patient may be seem in of suicidal ideation.  The patient  denies any other complaints.  He admits he was very intoxicated after  drinking a pint of remy, gin, grey goose, and vodka.  Also admits to  smoking marijuana, but denies other substance abuse recently (although  he took ecstasy 1 week ago).  The patient is very adamant that he wants  to go home because of I have assured to take care.  The patient states  that his mother recently died and his family issue is going on and  that he is frustrated.   MEDICATIONS:  The patient's medicines include Coumadin 5 mg tablet,  which he is supposed to take every day except Friday, when he is to take  a half tablet.   ALLERGIES:  The patient has no known drug allergies.   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   PAST SURGICAL HISTORY:  None.   FAMILY HISTORY:  Mother has coronary artery disease and has suffered a  stroke.  Both sisters have hypertension and his brother died of coronary  artery disease at age of 76.   SOCIAL HISTORY:  The patient is married with 2 children.  He is  unemployed.  He is a Engineer, agricultural.  Mother passed away recently  at the age of 41.  The patient has been smoking a pack per day since age  70.  He admits to heavy alcohol previously now he states it is sporadic.  Recently UDS have been positive and the patient admits to marijuana.  However, denies cocaine.   REVIEW OF SYSTEMS:  The patient denies all complaints as per HPI.   PHYSICAL EXAMINATION:  GENERAL:  The patient is drowsy, but oriented x3.  HEAD:  Normocephalic and atraumatic.  EYES:  Pupils equal, round, and reactive.  PERRLA.  Extraocular  movements are intact.  He has red conjunctiva.  NOSE:  External nasal examination shows no deformity or inflammation.  Nasal mucosa are pink and moist.  MOUTH:  The patient has moist mucosa membranes and fairly good  dentition.  NECK:  Supple.  Full range of motion.  CHEST:  Wall is nontender.  LUNGS/COR:  Poor effort but breath sounds are normal except mildly  decreased in lower lobes bilaterally.  Heart is normal rate, regular,  and rhythm.  No murmurs, rubs, or gallops.  ABDOMEN:  Soft, nontender, and nondistended.  Positive bowel sounds with  no organomegaly or hernia is noted.  Pulses are +2 bilaterally, dorsalis pedal pulses.  EXTREMITIES:  No clubbing, cyanosis, or edema and calves are nontender.  NEUROLOGIC:  The patient is drowsy, but oriented x3.  Cranial nerves II  through XII are intact.  Sensation is intact to light touch.  DTRs are  symmetrical and normal, however, the exam is limited by patient's hand  cuff and the patient has many tattoos.   LABORATORY DATA:   Sodium 145, potassium 3.5, chloride 110, bicarb 22,  BUN 13, creatinine 1.6, glucose 96, WBC is 8.6 with 67% neutrophils, 24%  lymphocytes, 8% monocytes, hemoglobin 15.4, platelets are 259, INR is  1.6, alcohol is 68, and UDS is positive for cocaine and marijuana.   PROBLEMS:  1. Suicidal ideations.  The patient made statements to both wife and      EDP indicating suicidal ideation.  Claims he took some Coumadin.      The patient denied this to me, but had already been involuntarily      committed.  Psyche consult was called.  We will monitor INR daily      after speaking with pharmacy.  He was told that it could a day to      see bump in INR if the patient who really took 10 pills.  We will      have a sitter at bedside.  [employer] police states he can be in      handcuffs until psych evaluates since he has already tried to run.      UDS shows cocaine and marijuana positive.  2. Alcohol use.  The patient intoxicated once presented to ED.  We      will try number of drug and alcohol services at discharge.  We will      give patient banana bag and multivitamin.  3. Long-term anticoagulation:  The patient is subtherapeutic on      Coumadin with INR of 1.6.  He did not want to take it and could not      be bothered with appointment.  We will hold if the patient may have      taken 10 pills and need reversal.  We will followup closely if the      patient has history of DVT and PE.   DISPOSITION:  Pending psychiatric evaluation and a program monitoring of  Coumadin level.      Neena Rhymes, M.D.  Electronically Signed      Nestor Ramp, MD  Electronically Signed    KT/MEDQ  D:  05/22/2008  T:  05/23/2008  Job:  [id number]

## 2011-05-08 NOTE — Discharge Summary (Signed)
NAME:  Dean Shelton, Dean Shelton NO.:  [id number]   MEDICAL RECORD NO.:  [id number]          PATIENT TYPE:  INP   LOCATION:  5008                         FACILITY:  [facility]   PHYSICIAN:  Dean Ramp, MD        DATE OF BIRTH:  [birth year]   DATE OF ADMISSION:  05/22/2008  DATE OF DISCHARGE:  05/23/2008                               DISCHARGE SUMMARY   REASON FOR HOSPITALIZATION:  Polysubstance abuse with cystitis suspected  overdose on Coumadin.   SIGNIFICANT FINDINGS:  Initial PT was 19.1 and INR of 1.6.  Alcohol  screen was positive at 68.  Urine drug screen was positive for cocaine  and THC.  A point-of-care cardiac markers were negative and white blood  cell count was 8.6, hemoglobin 15.4, and platelet count 259 with 60-70%  neutrophils.  Electrolytes on admission were within normal limits.  Repeat PT/INR on the morning several hours after admission showed an INR  of 2.3 and PT of 26.0.   BRIEF HOSPITAL COURSE:  The patient is a 40 year old male who presented  to the emergency department with polysubstance abuse and suspected  overdose  of Coumadin.  Per one report to medical personnel, the patient  stated that he took about 10 Coumadin.  The patient is on 5 mg tablets  daily at home.  The patient has bilateral pulmonary emboli with history  of DVT and is undergoing treatment for these.  To other medical  providers, the patient reported that he took only recommend daily dosage  of Coumadin, however, given his alcohol urine drug screen findings and  erratic behavior on admission to the emergency department.  The patient  was placed under involuntary commitment for medical stabilization and  transferred to [facility].  While in the ED, the patient  apparently per report try to leave and security had to apprehend him in  the parking lot.  The patient was handcuffed to his bed for a time and  then after being agreeable to undergoing medical stabilization and  observation with psychiatric consultation pending.  He was on handcuffed  and had no further incidents that is until late evening hours of May 23, 2008.  Psychiatry was consulted.  They came by to see the patient and  deemed that he was indeed a danger to himself and discussed with him the  possibility and transferred to an Inpatient [facility]al [facility] Service.  The patient was actually agreeable to this transfer to [facility], although the bed was not available at the time of the  consultation, moreover, the patient required further medical monitoring  for further stabilization and monitoring of his INR given that we did  not know the number of Coumadin that he took.  Given that the patient  was agreeable to inpatient treatment.  IVC community papers were not  renewed after 24 hours.  They were initially instituted in the emergency  department upon admission by the to the ED physician, Dr. Patrica Shelton, but  again given that the patient was agreeable to inpatient transferred to  [facility] IVC papers were not warranted nor were they  renewed.  Per nursing on the evening on May 23, 2008, the patient was on  the phone with his wife and had a somewhat heated discussion where he  asked her to bring him some clothes to the hospital.  Apparently, the  wife refused and the patient became upset.  The patient escalated  quickly and left his hospital room, and walked down to the hall before  security could be called.  The patient had left the floor and had left  the hospital prior to security's his arrival to the scene.  Family  medicine teaching service was called after the patient and had already  left the hospital in place for and return to follow in given that IVC  papers had run out.  The patient was considered to left AMA given  psychiatric consultation in which the patient was deemed to be danger to  himself or others.  IVC papers were resubmitted to the  Magistrate's  office.  Giving the police authorization to apprehend the patient to  bring him back for further medical evaluation and monitoring.  Unfortunately, at the time of this dictation, the patient is still not  in place custody as far as we know and is considered to left AMA.  IVC  papers had been issued and police have authority to apprehend the  patient, when initially to find him.  This discharge summary is dictated  for this patient who has left against medical advice.   DISCHARGE MEDICATIONS:  None, as the patient has left AMA.   PROCEDURES:  None.   CONSULTATIONS:  Psychiatry.   Pending issues, issues to be followed up.  The patient needs further  medical stabilization, follow up with his PT and INR upon his return to  the hospital when and if that does happened.  The patient has also need  psychiatric referral to [facility] for inpatient  psychiatric treatment. Please note that the patient was not actually  discharge but left AMA.      Dean Soman, MD  Electronically Signed      Dean Ramp, MD  Electronically Signed    TE/MEDQ  D:  05/24/2008  T:  05/24/2008  Job:  [id number]

## 2011-05-11 NOTE — Discharge Summary (Signed)
NAME:  Dean Shelton, Dean Shelton NO.:  [id number]   MEDICAL RECORD NO.:  [id number]          PATIENT TYPE:  IPS   LOCATION:  0605                          FACILITY:  [facility]   PHYSICIAN:  Jasmine Pang, M.D. DATE OF BIRTH:  [birth year]   DATE OF ADMISSION:  06/25/2008  DATE OF DISCHARGE:  06/28/2008                               DISCHARGE SUMMARY   Dictation ended at this point.      Jasmine Pang, M.D.  Electronically Signed     BHS/MEDQ  D:  07/08/2008  T:  07/09/2008  Job:  [id number]

## 2011-05-11 NOTE — Discharge Summary (Signed)
NAME:  Dean Shelton, Dean Shelton                ACCOUNT NO.:  [id number]   MEDICAL RECORD NO.:  [id number]          PATIENT TYPE:  PSY   LOCATION:                               FACILITY:  [facility]   PHYSICIAN:  Syed T. Arfeen, M.D.   DATE OF BIRTH:  10/28/1971   DATE OF ADMISSION:  06/26/2008  DATE OF DISCHARGE:                               DISCHARGE SUMMARY   Patient is a 40 year old separated African American man.  He was brought  in to the [facility] by police when his sister called  9-1-1. His commitment paper reported that the patient has been severely  depressed and he was felt to be suicidal.  The patient endorsed the  stressor being mother died in 12-20-2008and increased financial  distress, has lost his job.  The patient also endorsed that his wife  separated from him due to financial distress.  He endorse that he has  been feeling very down and lately has no desire to do anything.  The  patient also endorsed that has been poorly compliant with his Coumadin,  reported that he has been not sleeping very well.  He is using alcohol  and NyQuil to help him sleep.  The patient reported that he is using  alcohol and NyQuil to go to sleep, but his sister reported that he  wanted to die and that is how commitment papers were taken out.  The  patient was also admitted to [facility] a few  months ago for Coumadin overdose but left the hospital and did not see  the psychiatrist at that time.  The patient denies any psychosis or  suicidal plan at the time of discharge.   PAST PSYCHIATRIC HISTORY:  He denies any past psychiatric history.   SUBSTANCE ABUSE:  He stated that he started using alcohol when his mom  got sick, but denies any tremors, blackouts or seizures.   MEDICAL HISTORY:  The patient has a pulmonary embolism and DVT and he  was given the Coumadin, however, the patient has been poorly compliant  with his Coumadin.   MENTAL STATUS EXAMINATION:   At the time of admission, the patient was  alert and oriented.  He was appropriately groomed, dressed and  nourished.  He had a good eye contact.  He was speech was soft but  normal rhythm and rate and tone.  His mood was depressed and affect was  mood congruent.  Thought process was clear, rational and goal directed.  He denies any suicidal thoughts, homicidal thoughts or any visual or  auditory hallucinations.  His attention and concentration was good.  He  was alert and oriented x3.  Insight and judgment was fair.   HOSPITAL COURSE:  The patient was admitted and started his Coumadin and  p.r.n. trazodone medicine for sleep.  The patient was encouraged to  participate in group therapy.  He was also encouraged to consider  antidepressant.  The patient initially was reluctant to take the  medicine, however, after some encouragement he agreed to have a trial of  Celexa.  Celexa 20 mg was started and patient was monitored for any  adverse side effects.  On June 27, 2008, the patient was still somewhat  anxious and somewhat depressed, but reported no side effects of the  medication.  The family session was scheduled with sister, however, on  June 28, 2008, the patient requested that he wanted to be discharged and  felt no more severe depressed.  The patient reported that he wanted to  do an AA program and will follow up at the [facility] for his  medication.  At the time of discharge, his PT was 18.2 and INR 1.5.  The  patient was seen by Lynann Bologna, the nurse practitioner, who felt that  patient is ready for discharge.  He denies any suicidal thoughts,  homicidal thoughts, felt better with the medication, reported no side  effects of the medication.   DISCHARGE DIAGNOSES:  AXIS I:  Depressive disorder not otherwise  specified, rule out major depressive disorder.  AXIS II:  Deferred.  AXIS III:  History of deep venous thrombosis and pulmonary embolism.  AXIS IV:  Moderate.  AXIS  V:  65.   DISCHARGE MEDICATIONS:  1. Celexa 20 mg daily.  2. Trazodone 50 mg at bedtime as needed for sleep.  3. Coumadin 7.5 mg, doses and direction needed to be adjusted by the      primary care doctor.   DISCHARGE DISPOSITION:  The patient was referred to [facility] to see Dr. Lang Snow on June 29, 2008,.  The patient was  given the phone number and the direction of the facility.  Patient was  recommended to resume his follow-up care with his primary care doctor.      Syed T. Lolly Mustache, M.D.  Electronically Signed     STA/MEDQ  D:  07/14/2008  T:  07/14/2008  Job:  [id number]

## 2011-09-17 LAB — CBC
HCT: 38 — ABNORMAL LOW
HCT: 39.3
HCT: 40
HCT: 40.5
HCT: 41.4
HCT: 42.1
HCT: 43.1
Hemoglobin: 12.1 — ABNORMAL LOW
Hemoglobin: 13.4
Hemoglobin: 14.1
MCHC: 33.6
MCV: 84.8
MCV: 85.5
MCV: 86.4
MCV: 86.4
Platelets: 176
Platelets: 187
Platelets: 188
Platelets: 224
Platelets: 228
Platelets: 237
RBC: 4.25
RBC: 4.46
RBC: 4.6
RBC: 4.63
RBC: 4.91
RDW: 14.6
RDW: 15.1
WBC: 10.7 — ABNORMAL HIGH
WBC: 12.4 — ABNORMAL HIGH
WBC: 12.5 — ABNORMAL HIGH
WBC: 15.9 — ABNORMAL HIGH
WBC: 16.5 — ABNORMAL HIGH
WBC: 7.5
WBC: 7.6

## 2011-09-17 LAB — PROTIME-INR
INR: 1
INR: 1.2
INR: 1.6 — ABNORMAL HIGH
INR: 2.5 — ABNORMAL HIGH
INR: 2.8 — ABNORMAL HIGH
Prothrombin Time: 13.7
Prothrombin Time: 15.5 — ABNORMAL HIGH
Prothrombin Time: 19.3 — ABNORMAL HIGH
Prothrombin Time: 27.9 — ABNORMAL HIGH
Prothrombin Time: 30.6 — ABNORMAL HIGH

## 2011-09-17 LAB — BASIC METABOLIC PANEL
BUN: 7
BUN: 8
BUN: 8
BUN: 8
BUN: 9
Chloride: 103
Chloride: 96
Chloride: 97
GFR calc Af Amer: >60
GFR calc Af Amer: >60
GFR calc Af Amer: >60
GFR calc Af Amer: >60
GFR calc non Af Amer: 56 — ABNORMAL LOW
GFR calc non Af Amer: >60
GFR calc non Af Amer: >60
GFR calc non Af Amer: >60
Potassium: 3.5
Potassium: 3.7
Potassium: 3.7
Potassium: 3.8
Potassium: 3.9
Potassium: 3.9
Sodium: 130 — ABNORMAL LOW
Sodium: 131 — ABNORMAL LOW
Sodium: 134 — ABNORMAL LOW
Sodium: 137

## 2011-09-17 LAB — ANTIPHOSPHOLIPID SYNDROME EVAL, BLD
Anticardiolipin IgA: 23 (ref <13)
Anticardiolipin IgG: <7 — ABNORMAL LOW (ref <11)
Anticardiolipin IgM: <7 — ABNORMAL LOW (ref <10)
Antiphosphatidylserine IgA: <20 APS U/mL (ref <20.0)
Antiphosphatidylserine IgM: <25 MPS U/mL (ref <25.0)
Lupus Anticoagulant: DETECTED — AB
PTTLA Confirmation: 22.9 — ABNORMAL HIGH (ref <8.0)
dRVVT Incubated 1:1 Mix: 49.6 — ABNORMAL HIGH (ref 36.1–47.0)

## 2011-09-17 LAB — I-STAT 8, (EC8 V) (CONVERTED LAB)
BUN: 10
Bicarbonate: 28.1 — ABNORMAL HIGH
Chloride: 103
Glucose, Bld: 100 — ABNORMAL HIGH
HCT: 49
Operator id: 234501
pCO2, Ven: 51.4 — ABNORMAL HIGH
pH, Ven: 7.345 — ABNORMAL HIGH

## 2011-09-17 LAB — COMPREHENSIVE METABOLIC PANEL
AST: 16
Albumin: 3.3 — ABNORMAL LOW
BUN: 11
BUN: 7
CO2: 29
Calcium: 8.4
Creatinine, Ser: 1.03
Creatinine, Ser: 1.35
GFR calc Af Amer: >60
GFR calc non Af Amer: >60
Glucose, Bld: 108 — ABNORMAL HIGH
Potassium: 3.6
Total Protein: 7

## 2011-09-17 LAB — DIFFERENTIAL
Basophils Absolute: 0
Basophils Relative: 0
Eosinophils Absolute: 0
Eosinophils Relative: 0
Lymphocytes Relative: 10 — ABNORMAL LOW
Lymphs Abs: 0.8
Lymphs Abs: 1.3
Neutrophils Relative %: 79 — ABNORMAL HIGH
Neutrophils Relative %: 80 — ABNORMAL HIGH

## 2011-09-17 LAB — URINALYSIS, ROUTINE W REFLEX MICROSCOPIC
Glucose, UA: NEGATIVE
Hgb urine dipstick: NEGATIVE
Specific Gravity, Urine: 1.018
pH: 7

## 2011-09-17 LAB — LIPASE, BLOOD: Lipase: 15

## 2011-09-17 LAB — POCT I-STAT CREATININE
Creatinine, Ser: 1.6 — ABNORMAL HIGH
Operator id: 234501

## 2011-09-19 LAB — POCT I-STAT, CHEM 8
Calcium, Ion: 1.15
Calcium, Ion: 1.2
Chloride: 104
HCT: 50
HCT: 51
Hemoglobin: 17
Hemoglobin: 17.3 — ABNORMAL HIGH
Sodium: 145
TCO2: 22
TCO2: 25

## 2011-09-19 LAB — DIFFERENTIAL
Basophils Relative: 0
Basophils Relative: 1
Eosinophils Absolute: 0
Monocytes Relative: 6
Monocytes Relative: 8
Neutro Abs: 5.7
Neutro Abs: 8.3 — ABNORMAL HIGH
Neutrophils Relative %: 67
Neutrophils Relative %: 82 — ABNORMAL HIGH

## 2011-09-19 LAB — CARDIAC PANEL(CRET KIN+CKTOT+MB+TROPI)
CK, MB: 1.2
CK, MB: 1.4
CK, MB: 1.6
Relative Index: 0.5
Relative Index: 0.5
Total CK: 285 — ABNORMAL HIGH
Troponin I: 0.01

## 2011-09-19 LAB — COMPREHENSIVE METABOLIC PANEL
ALT: 13
AST: 16
Albumin: 3.5
Alkaline Phosphatase: 52
Calcium: 9
GFR calc Af Amer: >60
Potassium: 3.6
Sodium: 139
Total Protein: 6.3

## 2011-09-19 LAB — LIPID PANEL
HDL: 29 — ABNORMAL LOW
Total CHOL/HDL Ratio: 5.2
VLDL: 23

## 2011-09-19 LAB — CBC
HCT: 46.4
Hemoglobin: 13.9
Hemoglobin: 15.4
MCHC: 33.2
MCHC: 33.3
MCV: 86.8
Platelets: 204
Platelets: 259
RBC: 4.78
RBC: 5.35
RDW: 15.6 — ABNORMAL HIGH
WBC: 10.1
WBC: 6
WBC: 8.6

## 2011-09-19 LAB — RAPID URINE DRUG SCREEN, HOSP PERFORMED
Amphetamines: NOT DETECTED
Amphetamines: NOT DETECTED
Benzodiazepines: NOT DETECTED
Tetrahydrocannabinol: NOT DETECTED
Tetrahydrocannabinol: POSITIVE — AB

## 2011-09-19 LAB — PROTIME-INR
INR: 1
INR: 1.6 — ABNORMAL HIGH
INR: 2.3 — ABNORMAL HIGH
Prothrombin Time: 13.4
Prothrombin Time: 19.1 — ABNORMAL HIGH
Prothrombin Time: 26 — ABNORMAL HIGH

## 2011-09-19 LAB — POCT CARDIAC MARKERS
Myoglobin, poc: 97.5
Operator id: 151321
Operator id: 264031
Troponin i, poc: <0.05

## 2011-09-19 LAB — APTT: aPTT: 29

## 2011-09-19 LAB — ETHANOL: Alcohol, Ethyl (B): 68 — ABNORMAL HIGH

## 2011-09-20 LAB — URINE CULTURE: Colony Count: 3000

## 2011-09-20 LAB — COMPREHENSIVE METABOLIC PANEL
ALT: 10
Albumin: 3.9
Alkaline Phosphatase: 55
BUN: 13
Potassium: 3.8
Sodium: 139
Total Protein: 7

## 2011-09-20 LAB — DIFFERENTIAL
Basophils Relative: 1
Eosinophils Absolute: 0.1
Eosinophils Relative: 1
Lymphs Abs: 1.4
Monocytes Absolute: 0.6
Monocytes Relative: 11
Monocytes Relative: 8
Neutro Abs: 2.8

## 2011-09-20 LAB — URINALYSIS, ROUTINE W REFLEX MICROSCOPIC
Bilirubin Urine: NEGATIVE
Hgb urine dipstick: NEGATIVE
Specific Gravity, Urine: 1.043 — ABNORMAL HIGH
pH: 5.5

## 2011-09-20 LAB — BASIC METABOLIC PANEL
BUN: 12
CO2: 25
Chloride: 105
Potassium: 3.5

## 2011-09-20 LAB — CBC
HCT: 48.7
MCHC: 33.5
MCV: 87
Platelets: 198
Platelets: 206
RBC: 5.59
RDW: 14.5
WBC: 7.1

## 2011-09-20 LAB — PROTIME-INR
INR: 1.6 — ABNORMAL HIGH
Prothrombin Time: 18.2 — ABNORMAL HIGH
Prothrombin Time: 19.5 — ABNORMAL HIGH

## 2011-09-20 LAB — RAPID URINE DRUG SCREEN, HOSP PERFORMED
Barbiturates: NOT DETECTED
Cocaine: POSITIVE — AB

## 2011-09-20 LAB — APTT: aPTT: 35

## 2011-09-20 LAB — ETHANOL: Alcohol, Ethyl (B): <5

## 2011-09-20 LAB — TSH: TSH: 0.904 (ref 0.350–4.500)

## 2018-10-01 ENCOUNTER — Ambulatory Visit (HOSPITAL_COMMUNITY)
Admission: EM | Admit: 2018-10-01 | Discharge: 2018-10-01 | Disposition: A | Discharge status: Home / Self Care | Payer: Self-pay | Attending: Family Medicine | Admitting: Family Medicine

## 2018-10-01 ENCOUNTER — Encounter (HOSPITAL_COMMUNITY): Payer: Self-pay | Admitting: Emergency Medicine

## 2018-10-01 DIAGNOSIS — Z760 Encounter for issue of repeat prescription: Secondary | ICD-10-CM

## 2018-10-01 DIAGNOSIS — Z86718 Personal history of other venous thrombosis and embolism: Secondary | ICD-10-CM

## 2018-10-01 HISTORY — DX: Acute embolism and thrombosis of unspecified deep veins of unspecified lower extremity: I82.409

## 2018-10-01 MED ORDER — APIXABAN 5 MG PO TABS: 5.0000 mg | ORAL_TABLET | Freq: Two times a day (BID) | ORAL | 1 refills | Status: DC

## 2018-10-01 NOTE — Discharge Instructions (Signed)
It was nice meeting you!!  We are refilling your medication I am giving you a few primary care contacts for further management.

## 2018-10-01 NOTE — ED Triage Notes (Signed)
Pt states he needs a refill on his eliquis, ran out this morning. Has been on it a few years for "blood clots".

## 2018-10-03 NOTE — ED Provider Notes (Signed)
[facility]    CSN: [id number] Arrival date & time: 10/01/18  0941     History   Chief Complaint No chief complaint on file.   HPI Dean Shelton is a 47 y.o. male.   Pt is a 47 year old male that presents with PMH of DVT, PE. He is here today requesting refill on Eliquis. He has been taking this for many years due to recurrent blood clots. He has been taking the medication and only has 1 left. He recently moved here and does not have a primary care physician. He denies any chest pain, SOB, calf pain, dizziness, headaches, numbness or tingling.   ROS per HPI      Past Medical History:  Diagnosis Date  . DVT (deep venous thrombosis) [facility])     Patient Active Problem List   Diagnosis Date Noted  . ALCOHOL USE 05/12/2008  . TOBACCO USE 05/12/2008  . WEAKNESS, LEFT SIDE OF BODY 05/12/2008  . CHEST PAIN 05/12/2008  . LEG PAIN, RIGHT 02/24/2008  . PULMONARY EMBOLISM 02/18/2008  . DEEP VENOUS THROMBOPHLEBITIS 02/18/2008    History reviewed. No pertinent surgical history.     Home Medications    Prior to Admission medications   Medication Sig Start Date End Date Taking? Authorizing Provider  apixaban (ELIQUIS) 5 MG TABS tablet Take 1 tablet (5 mg total) by mouth 2 (two) times daily. 10/01/18   Janace Aris, NP    Family History Family History  Problem Relation Age of Onset  . Healthy Neg Hx     Social History Social History   Tobacco Use  . Smoking status: Never Smoker  Substance Use Topics  . Alcohol use: Never    Frequency: Never  . Drug use: Never     Allergies   Patient has no known allergies.   Review of Systems Review of Systems   Physical Exam Triage Vital Signs ED Triage Vitals  Enc Vitals Group     BP 10/01/18 1013 138/86     Pulse Rate 10/01/18 1013 89     Resp 10/01/18 1013 16     Temp 10/01/18 1013 98.3 F (36.8 C)     Temp Source 10/01/18 1013 Oral     SpO2 10/01/18 1013 98 %     Weight --      Height --    Head Circumference --      Peak Flow --      Pain Score 10/01/18 1014 0     Pain Loc --      Pain Edu? --      Excl. in GC? --    No data found.  Updated Vital Signs BP 138/86 (BP Location: Left Arm)   Pulse 89   Temp 98.3 F (36.8 C) (Oral)   Resp 16   SpO2 98%   Visual Acuity Right Eye Distance:   Left Eye Distance:   Bilateral Distance:    Right Eye Near:   Left Eye Near:    Bilateral Near:     Physical Exam  Constitutional: He is oriented to person, place, and time. He appears well-developed and well-nourished.  Very pleasant. Non toxic or ill appearing.   HENT:  Head: Normocephalic and atraumatic.  Eyes: Conjunctivae are normal.  Neck: Normal range of motion.  Cardiovascular: Normal rate, regular rhythm, normal heart sounds and intact distal pulses.  Pulmonary/Chest: Effort normal and breath sounds normal.  Lungs clear in all fields. No dyspnea or distress. No retractions  or nasal flaring.   Musculoskeletal: Normal range of motion.  Neurological: He is alert and oriented to person, place, and time.  Skin: Skin is warm and dry.  Psychiatric: He has a normal mood and affect.  Nursing note and vitals reviewed.    UC Treatments / Results  Labs (all labs ordered are listed, but only abnormal results are displayed) Labs Reviewed - No data to display  EKG None  Radiology No results found.  Procedures Procedures (including critical care time)  Medications Ordered in UC Medications - No data to display  Initial Impression / Assessment and Plan / UC Course  I have reviewed the triage vital signs and the nursing notes.  Pertinent labs & imaging results that were available during my care of the patient were reviewed by me and considered in my medical decision making (see chart for details).     Refilled pt eliquis Went over the importance of finding a primary care provider.  Pt understanding.  Final Clinical Impressions(s) / UC Diagnoses   Final  diagnoses:  Medication refill     Discharge Instructions     It was nice meeting you!!  We are refilling your medication I am giving you a few primary care contacts for further management.      ED Prescriptions    Medication Sig Dispense Auth. Provider   apixaban (ELIQUIS) 5 MG TABS tablet Take 1 tablet (5 mg total) by mouth 2 (two) times daily. 90 tablet Janace Aris, NP     Controlled Substance Prescriptions [place] Controlled Substance Registry consulted? no   Janace Aris, NP 10/03/18 [phone]

## 2018-10-09 ENCOUNTER — Telehealth (HOSPITAL_COMMUNITY): Payer: Self-pay | Admitting: Emergency Medicine

## 2018-10-09 NOTE — Telephone Encounter (Signed)
Pt on the phone with his social worker "Database administrator". Pt gave verbal permission to discuss patients condition. Pt social worker stated the form that the provider signed when he visited last week needed to be signed by a physician as well as the NP. The company for the blood thinner eliquis was supposed to fax the form here for the MD to sign as well, but we have not received a fax. Told Latosha to contact the company to refax the form, and that I would look out for a fax and to call back if she hadn't heard from me in an hour or so. Social worker was insistant this be handled immediately, that she was unable to wait an hour for the forms to be signed and he needs medication now because his legs are swollen. Spoke with pt and social worker if he needs immediate attention, to go to the ER to have US done and possibly obtain medication quickly. Pt and social worker agreeable to plan.

## 2019-04-20 ENCOUNTER — Ambulatory Visit (INDEPENDENT_AMBULATORY_CARE_PROVIDER_SITE_OTHER): Payer: Self-pay

## 2019-04-20 ENCOUNTER — Ambulatory Visit (HOSPITAL_COMMUNITY)
Admission: EM | Admit: 2019-04-20 | Discharge: 2019-04-20 | Disposition: A | Discharge status: Home / Self Care | Payer: Self-pay | Attending: Family Medicine | Admitting: Family Medicine

## 2019-04-20 ENCOUNTER — Encounter (HOSPITAL_COMMUNITY): Payer: Self-pay

## 2019-04-20 ENCOUNTER — Other Ambulatory Visit: Payer: Self-pay

## 2019-04-20 DIAGNOSIS — Z23 Encounter for immunization: Secondary | ICD-10-CM

## 2019-04-20 DIAGNOSIS — L980 Pyogenic granuloma: Secondary | ICD-10-CM

## 2019-04-20 MED ORDER — TETANUS-DIPHTH-ACELL PERTUSSIS 5-2.5-18.5 LF-MCG/0.5 IM SUSP
INTRAMUSCULAR | Status: AC
  Filled 2019-04-20: qty 0.5

## 2019-04-20 MED ORDER — SULFAMETHOXAZOLE-TRIMETHOPRIM 800-160 MG PO TABS: 1.0000 | ORAL_TABLET | Freq: Two times a day (BID) | ORAL | 0 refills | Status: AC

## 2019-04-20 MED ORDER — TETANUS-DIPHTH-ACELL PERTUSSIS 5-2.5-18.5 LF-MCG/0.5 IM SUSP
0.5000 mL | Freq: Once | INTRAMUSCULAR | Status: AC
  Administered 2019-04-20: 0.5 mL via INTRAMUSCULAR

## 2019-04-20 NOTE — ED Notes (Signed)
Patient verbalizes understanding of discharge instructions. Opportunity for questioning and answers were provided. Patient discharged from [facility].  

## 2019-04-20 NOTE — ED Provider Notes (Signed)
[facility]    CSN: [id number] Arrival date & time: 04/20/19  1925     History   Chief Complaint Chief Complaint  Patient presents with  . Finger Injury    HPI Dean Shelton is a 48 y.o. male.   HPI Patient is here for a wound on his left fourth finger.  He got a metal splinter in this area and thought he got most of it out.  Since it has been draining he thinks he may be infection in it.  He has been keeping it bandaged and cleaning it with hydrogen peroxide.  Continues to have some drainage, it has a yellowish discoloration, it has pain with any touching or pressure.  When he took the bandage off the last couple of days he has noticed that there is a red bump protruding from the wound.  Unknown tetanus status.  No fever. Past Medical History:  Diagnosis Date  . DVT (deep venous thrombosis) [facility](HCC)     Patient Active Problem List   Diagnosis Date Noted  . ALCOHOL USE 05/12/2008  . TOBACCO USE 05/12/2008  . WEAKNESS, LEFT SIDE OF BODY 05/12/2008  . CHEST PAIN 05/12/2008  . LEG PAIN, RIGHT 02/24/2008  . PULMONARY EMBOLISM 02/18/2008  . DEEP VENOUS THROMBOPHLEBITIS 02/18/2008    History reviewed. No pertinent surgical history.     Home Medications    Prior to Admission medications   Medication Sig Start Date End Date Taking? Authorizing Provider  apixaban (ELIQUIS) 5 MG TABS tablet Take 1 tablet (5 mg total) by mouth 2 (two) times daily. 10/01/18  Yes Bast, Traci A, NP  sulfamethoxazole-trimethoprim (BACTRIM DS) 800-160 MG tablet Take 1 tablet by mouth 2 (two) times daily for 7 days. 04/20/19 04/27/19  Eustace MooreNelson, Brittnee Gaetano Sue, MD    Family History Family History  Problem Relation Age of Onset  . Healthy Neg Hx     Social History Social History   Tobacco Use  . Smoking status: Never Smoker  . Smokeless tobacco: Never Used  Substance Use Topics  . Alcohol use: Never    Frequency: Never  . Drug use: Never     Allergies   Patient has no known allergies.    Review of Systems Review of Systems  Constitutional: Negative for chills and fever.  HENT: Negative for ear pain and sore throat.   Eyes: Negative for pain and visual disturbance.  Respiratory: Negative for cough and shortness of breath.   Cardiovascular: Negative for chest pain and palpitations.  Gastrointestinal: Negative for abdominal pain and vomiting.  Genitourinary: Negative for dysuria and hematuria.  Musculoskeletal: Negative for arthralgias and back pain.  Skin: Positive for rash. Negative for color change.  Neurological: Negative for seizures and syncope.  All other systems reviewed and are negative.    Physical Exam Triage Vital Signs ED Triage Vitals  Enc Vitals Group     BP 04/20/19 1949 123/87     Pulse Rate 04/20/19 1949 88     Resp 04/20/19 1949 18     Temp 04/20/19 1949 99.4 F (37.4 C)     Temp Source 04/20/19 1949 Oral     SpO2 04/20/19 1949 99 %     Weight --      Height --      Head Circumference --      Peak Flow --      Pain Score 04/20/19 1948 6     Pain Loc --      Pain Edu? --  Excl. in GC? --    No data found.  Updated Vital Signs BP 123/87 (BP Location: Right Arm)   Pulse 88   Temp 99.4 F (37.4 C) (Oral)   Resp 18   SpO2 99%       Physical Exam Constitutional:      General: He is not in acute distress.    Appearance: He is well-developed.  HENT:     Head: Normocephalic and atraumatic.  Eyes:     Conjunctiva/sclera: Conjunctivae normal.     Pupils: Pupils are equal, round, and reactive to light.  Neck:     Musculoskeletal: Normal range of motion.  Cardiovascular:     Rate and Rhythm: Normal rate.  Pulmonary:     Effort: Pulmonary effort is normal. No respiratory distress.  Abdominal:     General: There is no distension.     Palpations: Abdomen is soft.  Musculoskeletal: Normal range of motion.  Skin:    General: Skin is warm and dry.     Comments: Wound on the pad of fourth finger with yellow discoloration of  skin and pyogenic granuloma protruding.  See pic  Neurological:     Mental Status: He is alert.        UC Treatments / Results  Labs (all labs ordered are listed, but only abnormal results are displayed) Labs Reviewed - No data to display  EKG None  Radiology No results found.  Procedures Procedures (including critical care time)  Medications Ordered in UC Medications  Tdap (BOOSTRIX) injection 0.5 mL (0.5 mLs Intramuscular Given 04/20/19 2016)    Initial Impression / Assessment and Plan / UC Course  I have reviewed the triage vital signs and the nursing notes.  Pertinent labs & imaging results that were available during my care of the patient were reviewed by me and considered in my medical decision making (see chart for details).     X-rays are negative.  There is no foreign body.  This appears to be an infection in his fingertip with a pyogenic granuloma.  The granuloma was treated with 2 applications of silver nitrate.  It started to show shrinkage promptly.  Patient is going to take 7 days of Septra DS for infection.  Return if there is any problems. Final Clinical Impressions(s) / UC Diagnoses   Final diagnoses:  Pyogenic granuloma     Discharge Instructions     Take the antibiotic 2 times a day for 7 full days Keep wound bandaged Wash once a day and put on clean Band-Aid Change Band-Aid sooner if it becomes dirty Expect gradual improvement over next several days Return as needed    ED Prescriptions    Medication Sig Dispense Auth. Provider   sulfamethoxazole-trimethoprim (BACTRIM DS) 800-160 MG tablet Take 1 tablet by mouth 2 (two) times daily for 7 days. 14 tablet Eustace Moore, MD     Controlled Substance Prescriptions [place] Controlled Substance Registry consulted? Not Applicable   Eustace Moore, MD 04/20/19 2020

## 2019-04-20 NOTE — ED Triage Notes (Signed)
Patient presents to Urgent Care with complaints of left fourth finger pain since getting a metal splinter in the tip of his finger 3 weeks ago. Patient states he had it bandaged and has been cleaning it with hydrogen peroxide and alcohol, yellow border around wound, hard tissue coming out of wound bed.

## 2019-04-20 NOTE — Discharge Instructions (Signed)
Take the antibiotic 2 times a day for 7 full days Keep wound bandaged Wash once a day and put on clean Band-Aid Change Band-Aid sooner if it becomes dirty Expect gradual improvement over next several days Return as needed

## 2019-05-04 ENCOUNTER — Encounter (HOSPITAL_COMMUNITY): Payer: Self-pay

## 2019-05-04 ENCOUNTER — Ambulatory Visit (HOSPITAL_COMMUNITY)
Admission: EM | Admit: 2019-05-04 | Discharge: 2019-05-04 | Disposition: A | Discharge status: Home / Self Care | Payer: Self-pay | Attending: Family Medicine | Admitting: Family Medicine

## 2019-05-04 DIAGNOSIS — L980 Pyogenic granuloma: Secondary | ICD-10-CM

## 2019-05-04 MED ORDER — SULFAMETHOXAZOLE-TRIMETHOPRIM 800-160 MG PO TABS: 1.0000 | ORAL_TABLET | Freq: Two times a day (BID) | ORAL | 0 refills | Status: AC

## 2019-05-04 MED ORDER — SILVER NITRATE-POT NITRATE 75-25 % EX MISC
CUTANEOUS | Status: AC
  Filled 2019-05-04: qty 1

## 2019-05-04 NOTE — ED Triage Notes (Signed)
Patient presents to Urgent Care with complaints of left fourth finger problem, recurrent since last week. Patient reports when he was here last week, the physician put silver nitrate on it and it peeled off, but the tissue grew back and is still swollen. Pt also requesting med refills be ordered through a discount website for his eliquis.

## 2019-05-04 NOTE — Discharge Instructions (Addendum)
Please establish with a primary care provider for management of your prescriptions as through urgent care we do not manage recurrent refills.  Please follow up with dermatology as you may need further removal of this granuloma.

## 2019-05-04 NOTE — ED Provider Notes (Signed)
[facility]    CSN: [id number] Arrival date & time: 05/04/19  1634     History   Chief Complaint Chief Complaint  Patient presents with  . Hand Pain    HPI Dean Shelton is a 48 y.o. male.   Dean Shelton presents with complaints of recurrent pyogenic granuloma. Was seen here 4/27 and took 7 days of antibiotics as well as received topical silver nitrite. He was concerned initially that a metal splinter was lodged in the finger at onset. Negative xray on 4/27. The granuloma partially peeled off but then recurred and is larger. Is tender. No active bleeding. Small amount of red/yellow drainage at times. No surrounding tissue redness or firmness. No joint involvement. No fevers. Requesting further refills of eliquis for history of DVT. Hx of PE and dvt. Doesn't have a PCP.     ROS per HPI, negative if not otherwise mentioned.      Past Medical History:  Diagnosis Date  . DVT (deep venous thrombosis) [facility](HCC)     Patient Active Problem List   Diagnosis Date Noted  . ALCOHOL USE 05/12/2008  . TOBACCO USE 05/12/2008  . WEAKNESS, LEFT SIDE OF BODY 05/12/2008  . CHEST PAIN 05/12/2008  . LEG PAIN, RIGHT 02/24/2008  . PULMONARY EMBOLISM 02/18/2008  . DEEP VENOUS THROMBOPHLEBITIS 02/18/2008    History reviewed. No pertinent surgical history.     Home Medications    Prior to Admission medications   Medication Sig Start Date End Date Taking? Authorizing Provider  apixaban (ELIQUIS) 5 MG TABS tablet Take 1 tablet (5 mg total) by mouth 2 (two) times daily. 10/01/18   Dahlia ByesBast, Traci A, NP  sulfamethoxazole-trimethoprim (BACTRIM DS) 800-160 MG tablet Take 1 tablet by mouth 2 (two) times daily for 10 days. 05/04/19 05/14/19  Georgetta HaberBurky, Bronwen Pendergraft B, NP    Family History Family History  Problem Relation Age of Onset  . Healthy Neg Hx     Social History Social History   Tobacco Use  . Smoking status: Never Smoker  . Smokeless tobacco: Never Used  Substance Use Topics  .  Alcohol use: Never    Frequency: Never  . Drug use: Never     Allergies   Patient has no known allergies.   Review of Systems Review of Systems   Physical Exam Triage Vital Signs ED Triage Vitals  Enc Vitals Group     BP 05/04/19 1652 (!) 130/91     Pulse Rate 05/04/19 1652 79     Resp 05/04/19 1652 18     Temp 05/04/19 1652 98.1 F (36.7 C)     Temp Source 05/04/19 1652 Oral     SpO2 05/04/19 1652 100 %     Weight --      Height --      Head Circumference --      Peak Flow --      Pain Score 05/04/19 1651 2     Pain Loc --      Pain Edu? --      Excl. in GC? --    No data found.  Updated Vital Signs BP (!) 130/91 (BP Location: Right Arm)   Pulse 79   Temp 98.1 F (36.7 C) (Oral)   Resp 18   SpO2 100%    Physical Exam Constitutional:      Appearance: He is well-developed.  Cardiovascular:     Rate and Rhythm: Normal rate and regular rhythm.  Pulmonary:     Effort: Pulmonary  effort is normal.     Breath sounds: Normal breath sounds.  Musculoskeletal:     Comments: Pyogenic granuloma noted to pad of 4th finger; no active drainage; tender to site but no surrounding tenderness or redness; see photos   Skin:    General: Skin is warm and dry.  Neurological:     Mental Status: He is alert and oriented to person, place, and time.        Silver nitrite applied with mild shrink noted. Dressed with ointment and gauze dressing.   UC Treatments / Results  Labs (all labs ordered are listed, but only abnormal results are displayed) Labs Reviewed - No data to display  EKG None  Radiology No results found.  Procedures Procedures (including critical care time)  Medications Ordered in UC Medications - No data to display  Initial Impression / Assessment and Plan / UC Course  I have reviewed the triage vital signs and the nursing notes.  Pertinent labs & imaging results that were available during my care of the patient were reviewed by me and  considered in my medical decision making (see chart for details).     Endorses some drainage which had improved with antibiotics. Infection as possible source so will provide additional course. Additional silver nitrite treatment provided today. Due to size I am concerned that this will be inadequate for complete resolution. May need cryotherapy or shave removal. Patient is on blood thinners, recommend follow up with dermatology for definitive treatment. Patient received eliquis refill in October and has still not established with a PCP. Options provided for establishing care to manage this long term medication. Patient verbalized understanding and agreeable to plan.   Final Clinical Impressions(s) / UC Diagnoses   Final diagnoses:  Pyogenic granuloma     Discharge Instructions     Please establish with a primary care provider for management of your prescriptions as through urgent care we do not manage recurrent refills.  Please follow up with dermatology as you may need further removal of this granuloma.    ED Prescriptions    Medication Sig Dispense Auth. Provider   sulfamethoxazole-trimethoprim (BACTRIM DS) 800-160 MG tablet Take 1 tablet by mouth 2 (two) times daily for 10 days. 20 tablet Georgetta Haber, NP     Controlled Substance Prescriptions [place] Controlled Substance Registry consulted? Not Applicable   Georgetta Haber, NP 05/04/19 1740

## 2019-05-27 ENCOUNTER — Other Ambulatory Visit: Payer: Self-pay

## 2019-05-27 ENCOUNTER — Encounter: Payer: Self-pay | Admitting: Registered Nurse

## 2019-05-27 ENCOUNTER — Ambulatory Visit (INDEPENDENT_AMBULATORY_CARE_PROVIDER_SITE_OTHER): Payer: Self-pay | Admitting: Registered Nurse

## 2019-05-27 VITALS — BP 118/78 | HR 95 | Temp 98.1°F | Ht 74.0 in | Wt 257.0 lb

## 2019-05-27 DIAGNOSIS — N529 Male erectile dysfunction, unspecified: Secondary | ICD-10-CM

## 2019-05-27 DIAGNOSIS — Z7901 Long term (current) use of anticoagulants: Secondary | ICD-10-CM

## 2019-05-27 MED ORDER — TADALAFIL 10 MG PO TABS: 5.0000 mg | ORAL_TABLET | Freq: Every day | ORAL | 0 refills | Status: DC | PRN

## 2019-05-27 MED ORDER — TADALAFIL 5 MG PO TABS: 5.0000 mg | ORAL_TABLET | ORAL | 0 refills | Status: DC | PRN

## 2019-05-27 NOTE — Patient Instructions (Signed)
° ° ° °  If you have lab work done today you will be contacted with your lab results within the next 2 weeks.  If you have not heard from us then please contact us. The fastest way to get your results is to register for My Chart. ° ° °IF you received an x-ray today, you will receive an invoice from [facility]. Please contact  Radiology at [phone] with questions or concerns regarding your invoice.  ° °IF you received labwork today, you will receive an invoice from [facility]. Please contact [facility] at [phone] with questions or concerns regarding your invoice.  ° °Our billing staff will not be able to assist you with questions regarding bills from these companies. ° °You will be contacted with the lab results as soon as they are available. The fastest way to get your results is to activate your My Chart account. Instructions are located on the last page of this paperwork. If you have not heard from us regarding the results in 2 weeks, please contact this office. °  ° ° ° °

## 2019-05-27 NOTE — Progress Notes (Signed)
New Patient Office Visit  Subjective:  Patient ID: Dean Shelton, male    DOB: 05/19/1971  Age: 48 y.o. MRN: [id number]  CC:  Chief Complaint  Patient presents with  . Establish Care    pt needs a PCP to manage medication     HPI Dean ReeseRobert Ayad presents to establish care with PCP  History of 2 DVTs - has been on eliquis 5mg  PO bid with good effect. Does not have insurance, gets eliquis through medication program  Has recently had issues with ED: has trouble achieving and maintaining erection. Will trial Cialis 5mg  PO every other day as needed. GoodRx coupon given   No other complaints at this time. Hoping to get insurance soon, until then, will put off other labs until he gets insurance hopefully in July.   Past Medical History:  Diagnosis Date  . DVT (deep venous thrombosis) (HCC)     History reviewed. No pertinent surgical history.  Family History  Problem Relation Age of Onset  . Healthy Neg Hx     Social History   Socioeconomic History  . Marital status: Married    Spouse name: Not on file  . Number of children: 3  . Years of education: Not on file  . Highest education level: Not on file  Occupational History  . Occupation: Estate agentforklift operator  Social Needs  . Financial resource strain: Not hard at all  . Food insecurity:    Worry: Never true    Inability: Never true  . Transportation needs:    Medical: No    Non-medical: No  Tobacco Use  . Smoking status: Former Smoker    Packs/day: 0.50    Years: 3.00    Pack years: 1.50    Types: Cigarettes    Last attempt to quit: 05/26/2005    Years since quitting: 14.0  . Smokeless tobacco: Never Used  Substance and Sexual Activity  . Alcohol use: Never    Frequency: Never  . Drug use: Never  . Sexual activity: Yes  Lifestyle  . Physical activity:    Days per week: 5 days    Minutes per session: 50 min  . Stress: Not at all  Relationships  . Social connections:    Talks on phone: Three times a week   Gets together: Twice a week    Attends religious service: Never    Active member of club or organization: No    Attends meetings of clubs or organizations: Never    Relationship status: Married  . Intimate partner violence:    Fear of current or ex partner: No    Emotionally abused: No    Physically abused: No    Forced sexual activity: No  Other Topics Concern  . Not on file  Social History Narrative  . Not on file    ROS Review of Systems  Constitutional: Negative.   HENT: Negative.   Eyes: Negative.   Respiratory: Negative.   Cardiovascular: Negative.   Gastrointestinal: Negative.   Endocrine: Negative.   Genitourinary: Negative.   Musculoskeletal: Negative.   Skin: Negative.   Allergic/Immunologic: Negative.   Neurological: Negative.   Hematological: Negative.   Psychiatric/Behavioral: Negative.   All other systems reviewed and are negative.   Objective:   Today's Vitals: BP 118/78   Pulse 95   Temp 98.1 F (36.7 C) (Oral)   Ht 6\' 2"  (1.88 m)   Wt 257 lb (116.6 kg)   SpO2 95%   BMI 33.00 kg/m  Physical Exam Vitals signs and nursing note reviewed.  Cardiovascular:     Rate and Rhythm: Normal rate and regular rhythm.  Pulmonary:     Effort: Pulmonary effort is normal.     Breath sounds: Normal breath sounds.  Abdominal:     General: Bowel sounds are normal.     Palpations: Abdomen is soft.     Assessment & Plan:   Problem List Items Addressed This Visit    None    Visit Diagnoses    On apixaban therapy    -  Primary   Relevant Orders   Comprehensive metabolic panel   Erectile dysfunction, unspecified erectile dysfunction type       Relevant Medications   tadalafil (CIALIS) 10 MG tablet      Outpatient Encounter Medications as of 05/27/2019  Medication Sig  . apixaban (ELIQUIS) 5 MG TABS tablet Take 1 tablet (5 mg total) by mouth 2 (two) times daily.  . tadalafil (CIALIS) 10 MG tablet Take 0.5 tablets (5 mg total) by mouth daily as needed  for erectile dysfunction.  . [DISCONTINUED] tadalafil (CIALIS) 5 MG tablet Take 1 tablet (5 mg total) by mouth every other day as needed for erectile dysfunction.   No facility-administered encounter medications on file as of 05/27/2019.     Follow-up: No follow-ups on file.   PLAN:  Limited visit today - patient does not have insurance but needed provider to refill Eliquis. Plans to have insurance when he is made full time permanent employee at his job in 2 weeks - he will return for CPE and labs.   Patient encouraged to call clinic with any questions, comments, or concerns.   Janeece Agee, NP

## 2019-05-28 LAB — COMPREHENSIVE METABOLIC PANEL
ALT: 16 IU/L (ref 0–44)
AST: 24 IU/L (ref 0–40)
Albumin/Globulin Ratio: 1.5 (ref 1.2–2.2)
Albumin: 4.6 g/dL (ref 4.0–5.0)
Alkaline Phosphatase: 45 IU/L (ref 39–117)
BUN/Creatinine Ratio: 13 (ref 9–20)
BUN: 17 mg/dL (ref 6–24)
Bilirubin Total: 0.6 mg/dL (ref 0.0–1.2)
CO2: 20 mmol/L (ref 20–29)
Calcium: 9.3 mg/dL (ref 8.7–10.2)
Chloride: 102 mmol/L (ref 96–106)
Creatinine, Ser: 1.29 mg/dL — ABNORMAL HIGH (ref 0.76–1.27)
GFR calc Af Amer: 75 mL/min/{1.73_m2} (ref >59)
GFR calc non Af Amer: 65 mL/min/{1.73_m2} (ref >59)
Globulin, Total: 3 g/dL (ref 1.5–4.5)
Glucose: 84 mg/dL (ref 65–99)
Potassium: 4 mmol/L (ref 3.5–5.2)
Sodium: 138 mmol/L (ref 134–144)
Total Protein: 7.6 g/dL (ref 6.0–8.5)

## 2019-06-02 ENCOUNTER — Telehealth: Payer: Self-pay | Admitting: Registered Nurse

## 2019-06-02 NOTE — Telephone Encounter (Signed)
Pt calling to check status. Pt would like a call back as soon as possible. Please advise   Copied from [place] [phone]. Topic: General - Other >> Jun 01, 2019  3:07 PM Celene Kras A wrote: Reason for CRM: Pt called stating the medication, tadalafil (CIALIS) 10 MG tablet, is not working for him please advise.

## 2019-06-03 NOTE — Telephone Encounter (Signed)
Tried to call pt with no answer at this time will try again later.

## 2019-06-03 NOTE — Telephone Encounter (Signed)
Spoke with Dean Shelton and he informed me that he tried the two tablets 1 hours before intercourse and that still was not working. He would like to know is there any other medication he can try at this time. Please advise

## 2019-06-03 NOTE — Telephone Encounter (Signed)
At this time, there are further lab tests or a referral that would have to be made. Unfortunately without those tests or referrals, we can't move forward with any additional medications at this time.   Thank you  Kathrin Ruddy, NP

## 2019-06-03 NOTE — Telephone Encounter (Signed)
Pt calling again to see about a call back.

## 2019-06-04 NOTE — Telephone Encounter (Signed)
Called pt to relay message, mailbox is full

## 2019-06-04 NOTE — Telephone Encounter (Signed)
Pt is calling back and waiting on a respond from the doctor

## 2019-06-17 ENCOUNTER — Encounter: Payer: Self-pay | Admitting: Registered Nurse

## 2019-06-17 ENCOUNTER — Other Ambulatory Visit: Payer: Self-pay

## 2019-06-17 ENCOUNTER — Ambulatory Visit: Payer: Self-pay | Admitting: Registered Nurse

## 2019-06-17 VITALS — BP 118/70 | HR 99 | Temp 98.6°F | Ht 74.0 in | Wt 262.0 lb

## 2019-06-17 DIAGNOSIS — N529 Male erectile dysfunction, unspecified: Secondary | ICD-10-CM | POA: Insufficient documentation

## 2019-06-17 MED ORDER — SILDENAFIL CITRATE 50 MG PO TABS: 50.0000 mg | ORAL_TABLET | Freq: Every day | ORAL | 0 refills | Status: DC | PRN

## 2019-06-17 NOTE — Progress Notes (Signed)
Established Patient Office Visit  Subjective:  Patient ID: Dean Shelton, male    DOB: 1971-07-08  Age: 48 y.o. MRN: [id number]  CC:  Chief Complaint  Patient presents with  . Medication Management    pt states the cialis he was prescribed is not workinf and would like to try something different     HPI Dean ReeseRobert Caldera presents for follow up on ED  Was given tadaladfil 10mg  PO PRN at previous visit. We discussed the ability of this medication to be taken in a variety of ways: 10-20mg  PRN, no more than 1 dose in 72 hours, or half of a tablet (5mg ) PO qd. [name] ended up taking multiple 10mg  doses on consecutive days d/t no effect - he has since ceased taking the tadalafil.  He reports that he has had trouble both achieving and maintaining erections. He states libido has not been an issue. He is able to predict sexual activity to some extent - as there are consistent times when both he and his partner are not working. He denies any other symptoms, lesions on penis or testicles, testicular pain, hypogonadism, changes to diet and exercise.  We discussed that the common course of treatment would warrant a referral to urology - however, Mr. Dean Shelton does not have insurance at this time. Given this fact, we are hoping to accomplish what we can in our office. Testosterone testing and referrals are therefore delayed. Additionally, we discussed that with a history of DVTs, he may not be a good candidate for hormone therapy regardless.  Past Medical History:  Diagnosis Date  . DVT (deep venous thrombosis) (HCC)     History reviewed. No pertinent surgical history.  Family History  Problem Relation Age of Onset  . Healthy Neg Hx     Social History   Socioeconomic History  . Marital status: Married    Spouse name: Not on file  . Number of children: 3  . Years of education: Not on file  . Highest education level: Not on file  Occupational History  . Occupation: Estate agentforklift operator  Social  Needs  . Financial resource strain: Not hard at all  . Food insecurity    Worry: Never true    Inability: Never true  . Transportation needs    Medical: No    Non-medical: No  Tobacco Use  . Smoking status: Former Smoker    Packs/day: 0.50    Years: 3.00    Pack years: 1.50    Types: Cigarettes    Quit date: 05/26/2005    Years since quitting: 14.0  . Smokeless tobacco: Never Used  Substance and Sexual Activity  . Alcohol use: Never    Frequency: Never  . Drug use: Never  . Sexual activity: Yes  Lifestyle  . Physical activity    Days per week: 5 days    Minutes per session: 50 min  . Stress: Not at all  Relationships  . Social Musicianconnections    Talks on phone: Three times a week    Gets together: Twice a week    Attends religious service: Never    Active member of club or organization: No    Attends meetings of clubs or organizations: Never    Relationship status: Married  . Intimate partner violence    Fear of current or ex partner: No    Emotionally abused: No    Physically abused: No    Forced sexual activity: No  Other Topics Concern  . Not  on file  Social History Narrative  . Not on file    Outpatient Medications Prior to Visit  Medication Sig Dispense Refill  . apixaban (ELIQUIS) 5 MG TABS tablet Take 1 tablet (5 mg total) by mouth 2 (two) times daily. 90 tablet 1  . tadalafil (CIALIS) 10 MG tablet Take 0.5 tablets (5 mg total) by mouth daily as needed for erectile dysfunction. 30 tablet 0   No facility-administered medications prior to visit.     No Known Allergies  ROS Review of Systems  Constitutional: Negative.   HENT: Negative.   Eyes: Negative.   Respiratory: Negative.   Cardiovascular: Negative.   Gastrointestinal: Negative.   Endocrine: Negative.   Genitourinary: Negative.  Negative for difficulty urinating, discharge, frequency, genital sores, hematuria, penile pain, penile swelling, scrotal swelling, testicular pain and urgency.   Musculoskeletal: Negative.   Skin: Negative.   Allergic/Immunologic: Negative.   Neurological: Negative.   Hematological: Negative.   Psychiatric/Behavioral: Negative.       Objective:    Physical Exam  Constitutional: He is oriented to person, place, and time. He appears well-developed and well-nourished.  Cardiovascular: Normal rate, regular rhythm, normal heart sounds, intact distal pulses and normal pulses.  Pulses:      Femoral pulses are 2+ on the right side and 2+ on the left side. Pulmonary/Chest: Effort normal. No respiratory distress.  Neurological: He is alert and oriented to person, place, and time.  Skin: Skin is warm and dry.  Psychiatric: He has a normal mood and affect. His behavior is normal. Judgment and thought content normal.  Nursing note and vitals reviewed.   BP 118/70   Pulse 99   Temp 98.6 F (37 C) (Oral)   Ht 6\' 2"  (1.88 m)   Wt 262 lb (118.8 kg)   SpO2 96%   BMI 33.64 kg/m  Wt Readings from Last 3 Encounters:  06/17/19 262 lb (118.8 kg)  05/27/19 257 lb (116.6 kg)     Health Maintenance Due  Topic Date Due  . HIV Screening  11/14/1985    There are no preventive care reminders to display for this patient.   Lab Results  Component Value Date   WBC 6.1 01/25/2010   HGB 13.1 01/25/2010   HCT 40.1 01/25/2010   MCV 88.8 01/25/2010   PLT 124 (L) 01/25/2010   Lab Results  Component Value Date   NA 138 05/27/2019   K 4.0 05/27/2019   CO2 20 05/27/2019   GLUCOSE 84 05/27/2019   BUN 17 05/27/2019   CREATININE 1.29 (H) 05/27/2019   BILITOT 0.6 05/27/2019   ALKPHOS 45 05/27/2019   AST 24 05/27/2019   ALT 16 05/27/2019   PROT 7.6 05/27/2019   ALBUMIN 4.6 05/27/2019   CALCIUM 9.3 05/27/2019   Lab Results  Component Value Date   CHOL  05/12/2008    151        ATP III CLASSIFICATION:  <200     mg/dL   Desirable  200-239  mg/dL   Borderline High  >=240    mg/dL   High   Lab Results  Component Value Date   HDL 29 (L)  05/12/2008   Lab Results  Component Value Date   LDLCALC  05/12/2008    99        Total Cholesterol/HDL:CHD Risk Coronary Heart Disease Risk Table                     Men   Women  1/2 Average Risk   3.4   3.3   Lab Results  Component Value Date   TRIG 113 05/12/2008   Lab Results  Component Value Date   CHOLHDL 5.2 05/12/2008   No results found for: HGBA1C    Assessment & Plan:   Problem List Items Addressed This Visit    None    Visit Diagnoses    Erectile dysfunction, unspecified erectile dysfunction type    -  Primary   Relevant Medications   sildenafil (VIAGRA) 50 MG tablet      Meds ordered this encounter  Medications  . DISCONTD: sildenafil (VIAGRA) 50 MG tablet    Sig: Take 1 tablet (50 mg total) by mouth daily as needed for erectile dysfunction.    Dispense:  30 tablet    Refill:  0    Order Specific Question:   Supervising Provider    Answer:   Collie SiadSTALLINGS, ZOE A [id number]]  . sildenafil (VIAGRA) 50 MG tablet    Sig: Take 1 tablet (50 mg total) by mouth daily as needed for erectile dysfunction.    Dispense:  30 tablet    Refill:  0    Order Specific Question:   Supervising Provider    Answer:   Doristine BosworthSTALLINGS, ZOE A [id number]]    Follow-up: No follow-ups on file.   PLAN:  Pt seen for ED follow up. We will try Sildenafil 50mg  Po PRN today, as it has a faster onset and shorter course of action than tadalafil. He was given a written rx and GoodRx coupons.  We made the mutual decision to delay testosterone testing and referral based on cost.   Pt is hoping to get insurance in July - he is going full time at his job at that time. We would like to see him back for a CPE and further workup when he is covered.  Patient encouraged to call clinic with any questions, comments, or concerns.   Janeece Ageeichard Naome Brigandi, NP

## 2019-06-17 NOTE — Patient Instructions (Signed)
° ° ° °  If you have lab work done today you will be contacted with your lab results within the next 2 weeks.  If you have not heard from us then please contact us. The fastest way to get your results is to register for My Chart. ° ° °IF you received an x-ray today, you will receive an invoice from [facility]. Please contact  Radiology at [phone] with questions or concerns regarding your invoice.  ° °IF you received labwork today, you will receive an invoice from [facility]. Please contact [facility] at [phone] with questions or concerns regarding your invoice.  ° °Our billing staff will not be able to assist you with questions regarding bills from these companies. ° °You will be contacted with the lab results as soon as they are available. The fastest way to get your results is to activate your My Chart account. Instructions are located on the last page of this paperwork. If you have not heard from us regarding the results in 2 weeks, please contact this office. °  ° ° ° °

## 2019-07-06 ENCOUNTER — Telehealth: Payer: Self-pay | Admitting: Registered Nurse

## 2019-07-06 NOTE — Telephone Encounter (Signed)
Medication: sildenafil (VIAGRA) 50 MG tablet [[id number]]   Has the patient contacted their pharmacy? Yes  (Agent: If no, request that the patient contact the pharmacy for the refill.) (Agent: If yes, when and what did the pharmacy advise?)  Preferred Pharmacy (with phone number or street name): [place], [place] - [address] [phone] (Phone) [phone] (Fax)    Agent: Please be advised that RX refills may take up to 3 business days. We ask that you follow-up with your pharmacy.

## 2019-07-06 NOTE — Telephone Encounter (Signed)
Patient is calling to have labs ordered for Testerone. Please advise Please call back when the order is placed. [phone]

## 2019-07-07 ENCOUNTER — Other Ambulatory Visit: Payer: Self-pay

## 2019-07-07 ENCOUNTER — Ambulatory Visit: Payer: Self-pay

## 2019-07-07 DIAGNOSIS — N529 Male erectile dysfunction, unspecified: Secondary | ICD-10-CM

## 2019-07-07 MED ORDER — SILDENAFIL CITRATE 50 MG PO TABS: 50.0000 mg | ORAL_TABLET | Freq: Every day | ORAL | 0 refills | Status: DC | PRN

## 2019-07-07 NOTE — Telephone Encounter (Signed)
Orders placed and pt informed

## 2019-07-07 NOTE — Telephone Encounter (Signed)
Rx sent to pharmacy   

## 2019-07-09 ENCOUNTER — Telehealth: Payer: Self-pay | Admitting: Registered Nurse

## 2019-07-09 LAB — TESTOSTERONE, FREE, TOTAL, SHBG
Sex Hormone Binding: 48.9 nmol/L (ref 16.5–55.9)
Testosterone, Free: 7.5 pg/mL (ref 6.8–21.5)
Testosterone: 392 ng/dL (ref 264–916)

## 2019-07-09 NOTE — Telephone Encounter (Signed)
Copied from [facility] [id number]. Topic: General - Other >> Jul 09, 2019  1:00 PM Ivar Drape wrote: Reason for CRM:   Patient would like the results of his recent labs

## 2019-07-10 NOTE — Telephone Encounter (Signed)
Called and informed pt of labs, he verbalized understanding.  

## 2019-08-03 ENCOUNTER — Other Ambulatory Visit: Payer: Self-pay | Admitting: Registered Nurse

## 2019-08-03 DIAGNOSIS — N529 Male erectile dysfunction, unspecified: Secondary | ICD-10-CM

## 2019-08-03 MED ORDER — SILDENAFIL CITRATE 50 MG PO TABS: 50.0000 mg | ORAL_TABLET | Freq: Every day | ORAL | 2 refills | Status: DC | PRN

## 2019-08-03 NOTE — Telephone Encounter (Signed)
[place], [place] - [address] [phone] (Phone) [phone] (Fax)   sildenafil (VIAGRA) 50 MG tablet  pls refill

## 2019-08-03 NOTE — Telephone Encounter (Signed)
Requested Prescriptions  Pending Prescriptions Disp Refills  . sildenafil (VIAGRA) 50 MG tablet 30 tablet 2    Sig: Take 1 tablet (50 mg total) by mouth daily as needed for erectile dysfunction.     Urology: Erectile Dysfunction Agents Passed - 08/03/2019  9:55 AM      Passed - Last BP in normal range    BP Readings from Last 1 Encounters:  06/17/19 118/70         Passed - Valid encounter within last 12 months    Recent Outpatient Visits          1 month ago Erectile dysfunction, unspecified erectile dysfunction type   [facility], NP   2 months ago On apixaban therapy   [facility], Delfino Lovett, NP

## 2019-09-10 ENCOUNTER — Telehealth: Payer: Self-pay

## 2019-09-10 NOTE — Telephone Encounter (Signed)
Pt. Came into the office to request Mr. Orland Mustard fill out a prescription form for Eliquis for the pt. To be able to afford the medication. Pt. Expressed urgency of need for the script to be filled out. Script was placed directly into [facility] box at the nurses station.

## 2019-09-14 ENCOUNTER — Telehealth: Payer: Self-pay

## 2019-09-14 NOTE — Telephone Encounter (Signed)
Spoke with pt about form that has been completed and he states he will come to the office to pick up. Copy has been placed at the front.

## 2019-09-15 NOTE — Telephone Encounter (Signed)
Form has been completed and pt has been informed to pick up

## 2019-11-02 ENCOUNTER — Other Ambulatory Visit: Payer: Self-pay | Admitting: Registered Nurse

## 2019-11-02 DIAGNOSIS — N529 Male erectile dysfunction, unspecified: Secondary | ICD-10-CM

## 2020-02-12 ENCOUNTER — Encounter (HOSPITAL_COMMUNITY): Payer: Self-pay | Admitting: Emergency Medicine

## 2020-02-12 ENCOUNTER — Other Ambulatory Visit: Payer: Self-pay

## 2020-02-12 ENCOUNTER — Ambulatory Visit (HOSPITAL_COMMUNITY)
Admission: EM | Admit: 2020-02-12 | Discharge: 2020-02-12 | Disposition: A | Discharge status: Home / Self Care | Payer: Self-pay | Attending: Family Medicine | Admitting: Family Medicine

## 2020-02-12 DIAGNOSIS — Z20822 Contact with and (suspected) exposure to covid-19: Secondary | ICD-10-CM

## 2020-02-12 DIAGNOSIS — B349 Viral infection, unspecified: Secondary | ICD-10-CM

## 2020-02-12 LAB — POC SARS CORONAVIRUS 2 AG: SARS Coronavirus 2 Ag: NEGATIVE

## 2020-02-12 LAB — POC SARS CORONAVIRUS 2 AG -  ED: SARS Coronavirus 2 Ag: NEGATIVE

## 2020-02-12 MED ORDER — OMEPRAZOLE 20 MG PO CPDR: 20.0000 mg | DELAYED_RELEASE_CAPSULE | Freq: Two times a day (BID) | ORAL | 0 refills | Status: DC

## 2020-02-12 NOTE — ED Triage Notes (Signed)
Pt c/o fever for about 1 week, states he feels sore in the epigastric area, states he drank some theraflu and as he was swallowing, he felt the soreness.

## 2020-02-12 NOTE — ED Provider Notes (Signed)
[facility]    CSN: [id number] Arrival date & time: 02/12/20  1301      History   Chief Complaint Chief Complaint  Patient presents with  . Fever    HPI Dean Shelton is a 49 y.o. male.   HPI  Patient states he is having fever chills sweats and heartburn for about a week.  Denies coughing or shortness of breath.  Denies runny stuffy nose or sore throat.  Denies headaches or body aches.  States he does feel fatigued.  He states he drank some TheraFlu this morning, and felt a burning in his upper stomach, points to epigastrium.  He states he does have a history of heartburn at times.  Past history is positive for tobacco use and alcohol use. He is on Eliquis for history of DVT.  No chest pain or shortness of breath. No known exposure to Covid. He has been having the symptoms all week.  He is continue to work in spite of the symptoms.  Past Medical History:  Diagnosis Date  . DVT (deep venous thrombosis) [facility])     Patient Active Problem List   Diagnosis Date Noted  . Erectile dysfunction 06/17/2019  . ALCOHOL USE 05/12/2008  . TOBACCO USE 05/12/2008  . WEAKNESS, LEFT SIDE OF BODY 05/12/2008  . CHEST PAIN 05/12/2008  . LEG PAIN, RIGHT 02/24/2008  . PULMONARY EMBOLISM 02/18/2008  . DEEP VENOUS THROMBOPHLEBITIS 02/18/2008    History reviewed. No pertinent surgical history.     Home Medications    Prior to Admission medications   Medication Sig Start Date End Date Taking? Authorizing Provider  apixaban (ELIQUIS) 5 MG TABS tablet Take 1 tablet (5 mg total) by mouth 2 (two) times daily. 10/01/18   Janace Aris, NP  omeprazole (PRILOSEC) 20 MG capsule Take 1 capsule (20 mg total) by mouth 2 (two) times daily before a meal. 02/12/20   Eustace Moore, MD  sildenafil (VIAGRA) 50 MG tablet TAKE 1 TABLET BY MOUTH ONCE DAILY AS NEEDED FOR ERECTILE DYSFUNCTION Patient not taking: Reported on 02/12/2020 11/02/19 02/12/20  Janeece Agee, NP    Family History Family  History  Problem Relation Age of Onset  . Healthy Neg Hx     Social History Social History   Tobacco Use  . Smoking status: Former Smoker    Packs/day: 0.50    Years: 3.00    Pack years: 1.50    Types: Cigarettes    Quit date: 05/26/2005    Years since quitting: 14.7  . Smokeless tobacco: Never Used  Substance Use Topics  . Alcohol use: Never  . Drug use: Never     Allergies   Patient has no known allergies.   Review of Systems Review of Systems  Constitutional: Positive for activity change, chills, diaphoresis and fever.  HENT: Negative for congestion, rhinorrhea and sore throat.   Respiratory: Negative for cough and shortness of breath.   Cardiovascular: Negative for chest pain.  Gastrointestinal: Positive for abdominal pain. Negative for diarrhea and vomiting.       Heartburn  Musculoskeletal: Negative for back pain, myalgias and neck pain.  Neurological: Negative for light-headedness and headaches.     Physical Exam Triage Vital Signs ED Triage Vitals  Enc Vitals Group     BP 02/12/20 1411 (!) 127/94     Pulse Rate 02/12/20 1411 (!) 104     Resp 02/12/20 1411 (!) 22     Temp 02/12/20 1411 99.5 F (37.5 C)  Temp src --      SpO2 02/12/20 1411 98 %     Weight --      Height --      Head Circumference --      Peak Flow --      Pain Score 02/12/20 1413 0     Pain Loc --      Pain Edu? --      Excl. in GC? --    No data found.  Updated Vital Signs BP (!) 127/94   Pulse (!) 104   Temp 99.5 F (37.5 C)   Resp (!) 22   SpO2 98%      Physical Exam Constitutional:      General: He is not in acute distress.    Appearance: He is well-developed and normal weight. He is ill-appearing.     Comments: Appears tired  HENT:     Head: Normocephalic and atraumatic.     Nose: Nose normal. No congestion.     Mouth/Throat:     Mouth: Mucous membranes are moist.     Pharynx: No posterior oropharyngeal erythema.     Comments: mask Eyes:      Conjunctiva/sclera: Conjunctivae normal.     Pupils: Pupils are equal, round, and reactive to light.  Cardiovascular:     Rate and Rhythm: Regular rhythm. Tachycardia present.     Heart sounds: Normal heart sounds.  Pulmonary:     Effort: Pulmonary effort is normal. No respiratory distress.     Breath sounds: Normal breath sounds.  Abdominal:     General: There is no distension.     Palpations: Abdomen is soft.     Tenderness: There is abdominal tenderness.     Comments: Mid epigastric mild tenderness  Musculoskeletal:        General: Normal range of motion.     Cervical back: Normal range of motion.  Skin:    General: Skin is warm and dry.  Neurological:     Mental Status: He is alert.  Psychiatric:        Mood and Affect: Mood normal.        Behavior: Behavior normal.      UC Treatments / Results  Labs (all labs ordered are listed, but only abnormal results are displayed) Labs Reviewed  POC SARS CORONAVIRUS 2 AG -  ED  POC SARS CORONAVIRUS 2 AG  POC negative Confirmation sent EKG   Radiology No results found.  Procedures Procedures (including critical care time)  Medications Ordered in UC Medications - No data to display  Initial Impression / Assessment and Plan / UC Course  I have reviewed the triage vital signs and the nursing notes.  Pertinent labs & imaging results that were available during my care of the patient were reviewed by me and considered in my medical decision making (see chart for details).      Final Clinical Impressions(s) / UC Diagnoses   Final diagnoses:  Viral illness  Suspected COVID-19 virus infection     Discharge Instructions     Take tylenol for pain and fever Take the omeprazole for stomach upset Rest and push fluids Covid test result is available on My Chart Rest at home a couple of days    ED Prescriptions    Medication Sig Dispense Auth. Provider   omeprazole (PRILOSEC) 20 MG capsule Take 1 capsule (20 mg  total) by mouth 2 (two) times daily before a meal. 14 capsule Eustace Moore, MD  PDMP not reviewed this encounter.   Raylene Everts, MD 02/12/20 [phone]

## 2020-02-12 NOTE — Discharge Instructions (Signed)
Take tylenol for pain and fever Take the omeprazole for stomach upset Rest and push fluids Covid test result is available on My Chart Rest at home a couple of days

## 2020-02-15 ENCOUNTER — Other Ambulatory Visit: Payer: Self-pay

## 2020-02-15 ENCOUNTER — Telehealth (INDEPENDENT_AMBULATORY_CARE_PROVIDER_SITE_OTHER): Payer: Self-pay | Admitting: Registered Nurse

## 2020-02-15 DIAGNOSIS — J189 Pneumonia, unspecified organism: Secondary | ICD-10-CM

## 2020-02-15 MED ORDER — AZITHROMYCIN 250 MG PO TABS: ORAL_TABLET | ORAL | 0 refills | Status: DC

## 2020-02-15 NOTE — Progress Notes (Signed)
Pt is following up with urgent care visit. He went for concerns of fever which only comes at night. Has been out of work for the past 4 days. He has also discovered some knots under both arms which are causing some tenderness. He will need return letter for work, requesting antibiotic for the fever.

## 2020-02-15 NOTE — Progress Notes (Signed)
Telemedicine Encounter- SOAP NOTE Established Patient  This telephone encounter was conducted with the patient's (or proxy's) verbal consent via audio telecommunications: yes  Patient was instructed to have this encounter in a suitably private space; and to only have persons present to whom they give permission to participate. In addition, patient identity was confirmed by use of name plus two identifiers (DOB and address).  I discussed the limitations, risks, security and privacy concerns of performing an evaluation and management service by telephone and the availability of in person appointments. I also discussed with the patient that there may be a patient responsible charge related to this service. The patient expressed understanding and agreed to proceed.  I spent a total of 14 minutes talking with the patient or their proxy.  No chief complaint on file.   Subjective   Dean Shelton is a 49 y.o. established patient. Telephone visit today for malaise  HPI Reports illness ongoing for around 1 week, maybe more. Was seen in urgent care and COVID tested negative.  Notes that he has extreme fatigue, some shob, heaviness in his chest, and swollen tender spots in both armpits. Has been feverish with high home temp of 103.68F. Sweats a lot at night.  Denies weight loss, palpable mass on chest, blood in sputum, visual changes, dependent edema, redness or swelling of one leg, sudden worsening of symptoms, unilateral symptoms He is still taking eliquis for hx of DVT and PE, he does not feel that he has a clot at this time. No sick contacts  Patient Active Problem List   Diagnosis Date Noted  . Erectile dysfunction 06/17/2019  . ALCOHOL USE 05/12/2008  . TOBACCO USE 05/12/2008  . WEAKNESS, LEFT SIDE OF BODY 05/12/2008  . CHEST PAIN 05/12/2008  . LEG PAIN, RIGHT 02/24/2008  . PULMONARY EMBOLISM 02/18/2008  . DEEP VENOUS THROMBOPHLEBITIS 02/18/2008    Past Medical History:  Diagnosis  Date  . DVT (deep venous thrombosis) (HCC)     Current Outpatient Medications  Medication Sig Dispense Refill  . apixaban (ELIQUIS) 5 MG TABS tablet Take 1 tablet (5 mg total) by mouth 2 (two) times daily. 90 tablet 1  . azithromycin (ZITHROMAX) 250 MG tablet Take 2 tablets on first day. Then take 1 daily. Finish entire supply. 6 tablet 0  . omeprazole (PRILOSEC) 20 MG capsule Take 1 capsule (20 mg total) by mouth 2 (two) times daily before a meal. (Patient not taking: Reported on 02/15/2020) 14 capsule 0   No current facility-administered medications for this visit.    No Known Allergies  Social History   Socioeconomic History  . Marital status: Married    Spouse name: Not on file  . Number of children: 3  . Years of education: Not on file  . Highest education level: Not on file  Occupational History  . Occupation: Estate agent  Tobacco Use  . Smoking status: Former Smoker    Packs/day: 0.50    Years: 3.00    Pack years: 1.50    Types: Cigarettes    Quit date: 05/26/2005    Years since quitting: 14.7  . Smokeless tobacco: Never Used  Substance and Sexual Activity  . Alcohol use: Never  . Drug use: Never  . Sexual activity: Yes  Other Topics Concern  . Not on file  Social History Narrative  . Not on file   Social Determinants of Health   Financial Resource Strain: Low Risk   . Difficulty of Paying Living Expenses: Not  hard at all  Food Insecurity: No Food Insecurity  . Worried About Charity fundraiser in the Last Year: Never true  . Ran Out of Food in the Last Year: Never true  Transportation Needs: No Transportation Needs  . Lack of Transportation (Medical): No  . Lack of Transportation (Non-Medical): No  Physical Activity: Sufficiently Active  . Days of Exercise per Week: 5 days  . Minutes of Exercise per Session: 50 min  Stress: No Stress Concern Present  . Feeling of Stress : Not at all  Social Connections: Somewhat Isolated  . Frequency of  Communication with Friends and Family: Three times a week  . Frequency of Social Gatherings with Friends and Family: Twice a week  . Attends Religious Services: Never  . Active Member of Clubs or Organizations: No  . Attends Archivist Meetings: Never  . Marital Status: Married  Human resources officer Violence: Not At Risk  . Fear of Current or Ex-Partner: No  . Emotionally Abused: No  . Physically Abused: No  . Sexually Abused: No    ROS Per hpi   Objective   Vitals as reported by the patient: There were no vitals filed for this visit.  Diagnoses and all orders for this visit:  Community acquired pneumonia, unspecified laterality -     azithromycin (ZITHROMAX) 250 MG tablet; Take 2 tablets on first day. Then take 1 daily. Finish entire supply.   PLAN  Concern for CAP given symptoms - but other causes cannot be ruled out with telemed visit. If he does not feel better with z pack, he will present to office for in person visit  Reviewed red flag symptoms and reasons to seek emergency care. With his history of DVT and PE, clot is major concern.   Work note given for this week.  Patient encouraged to call clinic with any questions, comments, or concerns.   I discussed the assessment and treatment plan with the patient. The patient was provided an opportunity to ask questions and all were answered. The patient agreed with the plan and demonstrated an understanding of the instructions.   The patient was advised to call back or seek an in-person evaluation if the symptoms worsen or if the condition fails to improve as anticipated.  I provided 14 minutes of non-face-to-face time during this encounter.  Maximiano Coss, NP  Primary Care at [facility]

## 2020-02-17 ENCOUNTER — Telehealth: Payer: Self-pay | Admitting: Registered Nurse

## 2020-02-17 NOTE — Telephone Encounter (Signed)
Pt wife called and would like to talk to providers nurse about his condition. Please advise. [phone]

## 2020-02-17 NOTE — Telephone Encounter (Signed)
Called patient and his wife who is a nurse is requesting to please speak with you . Per patient she stated that you told them to call back if not better , but it seems to take a turn for the worst . Patient has started to get fevers, headaches and sweating messing up the sheets. Also to weak to really get up and walk. If you could give Dean Shelton a call at [phone]. Thank you  Please Advise.

## 2020-02-17 NOTE — Telephone Encounter (Signed)
Called patient's wife and patient - stated he has improved today Again reviewed ED precautions - he should have low threshold for seeking emergency care. Pt and wife demonstrate understanding  Jari Sportsman, NP

## 2020-09-14 ENCOUNTER — Encounter: Payer: Self-pay | Admitting: Registered Nurse

## 2020-09-14 ENCOUNTER — Telehealth (INDEPENDENT_AMBULATORY_CARE_PROVIDER_SITE_OTHER): Payer: Self-pay | Admitting: Registered Nurse

## 2020-09-14 ENCOUNTER — Other Ambulatory Visit: Payer: Self-pay

## 2020-09-14 VITALS — Temp 99.9°F

## 2020-09-14 DIAGNOSIS — J189 Pneumonia, unspecified organism: Secondary | ICD-10-CM

## 2020-09-14 DIAGNOSIS — U071 COVID-19: Secondary | ICD-10-CM

## 2020-09-14 MED ORDER — AZITHROMYCIN 250 MG PO TABS: ORAL_TABLET | ORAL | 0 refills | Status: DC

## 2020-09-14 MED ORDER — ALBUTEROL SULFATE HFA 108 (90 BASE) MCG/ACT IN AERS: 2.0000 | INHALATION_SPRAY | Freq: Four times a day (QID) | RESPIRATORY_TRACT | 0 refills | Status: DC | PRN

## 2020-09-14 MED ORDER — BENZONATATE 200 MG PO CAPS: 200.0000 mg | ORAL_CAPSULE | Freq: Two times a day (BID) | ORAL | 0 refills | Status: DC | PRN

## 2020-09-14 MED ORDER — PREDNISONE 20 MG PO TABS: 40.0000 mg | ORAL_TABLET | Freq: Every day | ORAL | 0 refills | Status: AC

## 2020-09-14 NOTE — Patient Instructions (Signed)
° ° ° °  If you have lab work done today you will be contacted with your lab results within the next 2 weeks.  If you have not heard from us then please contact us. The fastest way to get your results is to register for My Chart. ° ° °IF you received an x-ray today, you will receive an invoice from [facility]. Please contact [facility] at [phone] with questions or concerns regarding your invoice.  ° °IF you received labwork today, you will receive an invoice from [facility]. Please contact [facility] at [phone] with questions or concerns regarding your invoice.  ° °Our billing staff will not be able to assist you with questions regarding bills from these companies. ° °You will be contacted with the lab results as soon as they are available. The fastest way to get your results is to activate your My Chart account. Instructions are located on the last page of this paperwork. If you have not heard from us regarding the results in 2 weeks, please contact this office. °  ° ° ° °

## 2020-09-14 NOTE — Progress Notes (Addendum)
Telemedicine Encounter- SOAP NOTE Established Patient  This telephone encounter was conducted with the patient's (or proxy's) verbal consent via audio telecommunications: yes  Patient was instructed to have this encounter in a suitably private space; and to only have persons present to whom they give permission to participate. In addition, patient identity was confirmed by use of name plus two identifiers (DOB and address).  I discussed the limitations, risks, security and privacy concerns of performing an evaluation and management service by telephone and the availability of in person appointments. I also discussed with the patient that there may be a patient responsible charge related to this service. The patient expressed understanding and agreed to proceed.  I spent a total of 16 minutes talking with the patient or their proxy.  Patient at home Provider in office  Chief Complaint  Patient presents with  . Covid Exposure    patient states he tested positive for covid he is experiencing some coughing , body aches, fever and congestion. Patient has took many cold/cough medications but nothing is working.      Subjective   Dean Shelton is a 49 y.o. established patient. Telephone visit today for COVID  HPI Tested positive for COVID 19 on Sept 12. Had symptoms for a few days prior However, seems to be doing worse.  Shob, coughing, aches, congestion all worsening OTCs not helping  His wife is a Engineer, civil (consulting) - reports his sats have dropped from high 90s to high 80s at rest. While he is able to keep his breath, he quickly loses it on exertion. His fevers have reached 102.62F. he is not having any vomiting or diarrhea His cough is productive with thick discolored sputum He is not having any blood streaked in his sputum at this time.  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 Still taking apixaban  Patient Active Problem  List   Diagnosis Date Noted  . Erectile dysfunction 06/17/2019  . ALCOHOL USE 05/12/2008  . TOBACCO USE 05/12/2008  . WEAKNESS, LEFT SIDE OF BODY 05/12/2008  . CHEST PAIN 05/12/2008  . LEG PAIN, RIGHT 02/24/2008  . PULMONARY EMBOLISM 02/18/2008  . DEEP VENOUS THROMBOPHLEBITIS 02/18/2008    Past Medical History:  Diagnosis Date  . DVT (deep venous thrombosis) (HCC)     Current Outpatient Medications  Medication Sig Dispense Refill  . apixaban (ELIQUIS) 5 MG TABS tablet Take 1 tablet (5 mg total) by mouth 2 (two) times daily. 90 tablet 1  . albuterol (VENTOLIN HFA) 108 (90 Base) MCG/ACT inhaler Inhale 2 puffs into the lungs every 6 (six) hours as needed for wheezing or shortness of breath. 8 g 0  . azithromycin (ZITHROMAX) 250 MG tablet Take 2 tablets on first day. Then take 1 daily. Finish entire supply. 6 tablet 0  . benzonatate (TESSALON) 200 MG capsule Take 1 capsule (200 mg total) by mouth 2 (two) times daily as needed for cough. 30 capsule 0  . predniSONE (DELTASONE) 20 MG tablet Take 2 tablets (40 mg total) by mouth daily with breakfast for 4 days. 8 tablet 0   No current facility-administered medications for this visit.    No Known Allergies  Social History   Socioeconomic History  . Marital status: Married    Spouse name: Not on file  . Number of children: 3  . Years of education: Not on file  . Highest education level: Not on file  Occupational History  .  Occupation: Estate agent  Tobacco Use  . Smoking status: Former Smoker    Packs/day: 0.50    Years: 3.00    Pack years: 1.50    Types: Cigarettes    Quit date: 05/26/2005    Years since quitting: 15.3  . Smokeless tobacco: Never Used  Vaping Use  . Vaping Use: Never used  Substance and Sexual Activity  . Alcohol use: Never  . Drug use: Never  . Sexual activity: Yes  Other Topics Concern  . Not on file  Social History Narrative  . Not on file   Social Determinants of Health   Financial Resource  Strain:   . Difficulty of Paying Living Expenses: Not on file  Food Insecurity:   . Worried About Programme researcher, broadcasting/film/video in the Last Year: Not on file  . Ran Out of Food in the Last Year: Not on file  Transportation Needs:   . Lack of Transportation (Medical): Not on file  . Lack of Transportation (Non-Medical): Not on file  Physical Activity:   . Days of Exercise per Week: Not on file  . Minutes of Exercise per Session: Not on file  Stress:   . Feeling of Stress : Not on file  Social Connections:   . Frequency of Communication with Friends and Family: Not on file  . Frequency of Social Gatherings with Friends and Family: Not on file  . Attends Religious Services: Not on file  . Active Member of Clubs or Organizations: Not on file  . Attends Banker Meetings: Not on file  . Marital Status: Not on file  Intimate Partner Violence:   . Fear of Current or Ex-Partner: Not on file  . Emotionally Abused: Not on file  . Physically Abused: Not on file  . Sexually Abused: Not on file    Review of Systems  Constitutional: Positive for chills, diaphoresis, fever and malaise/fatigue.  HENT: Positive for congestion.   Eyes: Negative.   Respiratory: Positive for cough, sputum production, shortness of breath and wheezing.   Gastrointestinal: Negative.   Genitourinary: Negative.   Musculoskeletal: Positive for myalgias.  Skin: Negative.   Neurological: Positive for headaches.  Endo/Heme/Allergies: Negative.   Psychiatric/Behavioral: Negative.     Objective   Vitals as reported by the patient: Today's Vitals   09/14/20 1321  Temp: 99.9 F (37.7 C)  TempSrc: Temporal    Dean Shelton was seen today for covid exposure.  Diagnoses and all orders for this visit:  COVID-19 -     azithromycin (ZITHROMAX) 250 MG tablet; Take 2 tablets on first day. Then take 1 daily. Finish entire supply. -     albuterol (VENTOLIN HFA) 108 (90 Base) MCG/ACT inhaler; Inhale 2 puffs into the lungs  every 6 (six) hours as needed for wheezing or shortness of breath. -     benzonatate (TESSALON) 200 MG capsule; Take 1 capsule (200 mg total) by mouth 2 (two) times daily as needed for cough. -     predniSONE (DELTASONE) 20 MG tablet; Take 2 tablets (40 mg total) by mouth daily with breakfast for 4 days. -     For home use only DME oxygen  Community acquired pneumonia, unspecified laterality -     azithromycin (ZITHROMAX) 250 MG tablet; Take 2 tablets on first day. Then take 1 daily. Finish entire supply.   PLAN  2L O2 via [place] given for home use to maintain sats  Albuterol for wheezing  With sputum production, suspect covid pna. Will  give z pack.   Prednisone burst for inflammation  Extremely strict ER precautions reviewed with patient and wife. If sats drop to mid 80s, cyanosis, dizziness/confusion, or any other red flag symptoms emerge, he is to proceed right to ER  Patient encouraged to call clinic with any questions, comments, or concerns.  I discussed the assessment and treatment plan with the patient. The patient was provided an opportunity to ask questions and all were answered. The patient agreed with the plan and demonstrated an understanding of the instructions.   The patient was advised to call back or seek an in-person evaluation if the symptoms worsen or if the condition fails to improve as anticipated.  I provided 16 minutes of non-face-to-face time during this encounter.  Janeece Agee, NP  Primary Care at [facility]

## 2020-09-15 ENCOUNTER — Telehealth: Payer: Self-pay | Admitting: Registered Nurse

## 2020-09-15 NOTE — Telephone Encounter (Signed)
Papers for DME has been printed/faxed to [facility].

## 2020-09-15 NOTE — Telephone Encounter (Signed)
Pt's wife called in to inform office that [facility] will not accept a paper DME order directly from the patient . The office requires the order to be faxed. Please assist.   Fax is [phone]

## 2020-11-04 ENCOUNTER — Other Ambulatory Visit: Payer: Self-pay | Admitting: Registered Nurse

## 2020-11-04 MED ORDER — APIXABAN 5 MG PO TABS: 5.0000 mg | ORAL_TABLET | Freq: Two times a day (BID) | ORAL | 1 refills | Status: DC

## 2020-11-04 NOTE — Telephone Encounter (Signed)
Medication: apixaban (ELIQUIS) 5 MG TABS tablet [[id number]]   Has the patient contacted their pharmacy? YES  (Agent: If no, request that the patient contact the pharmacy for the refill.) (Agent: If yes, when and what did the pharmacy advise?)  Preferred Pharmacy (with phone number or street name): [id number] [place], [place] - [address] AT [facility] [address] & [facility]  [address] [place] [address]  Phone:  [phone] Fax:  [phone]   Agent: Please be advised that RX refills may take up to 3 business days. We ask that you follow-up with your pharmacy.

## 2020-11-04 NOTE — Telephone Encounter (Signed)
Pt is requesting medication refill, however, we have not filled this blood thinner for pt.  Pt was last seen on 09/14/20 for a Video visit due to Covid concerns.   Please advise on refill request.

## 2020-11-10 MED ORDER — APIXABAN 5 MG PO TABS: 5.0000 mg | ORAL_TABLET | Freq: Two times a day (BID) | ORAL | 1 refills | Status: DC

## 2020-11-10 NOTE — Addendum Note (Signed)
Addended by: Lisabeth Pick on: 11/10/2020 09:23 AM   Modules accepted: Orders

## 2020-11-10 NOTE — Telephone Encounter (Signed)
Pt needs refill for apixaban (ELIQUIS) 5 MG TABS tablet  Sent to [facility] on [address] due to the other pharmacy system still being down / please advise

## 2021-01-28 ENCOUNTER — Other Ambulatory Visit: Payer: Self-pay | Admitting: Registered Nurse

## 2021-03-13 ENCOUNTER — Other Ambulatory Visit: Payer: Self-pay | Admitting: Registered Nurse

## 2021-03-13 MED ORDER — APIXABAN 5 MG PO TABS: 5.0000 mg | ORAL_TABLET | Freq: Two times a day (BID) | ORAL | 0 refills | Status: DC

## 2021-03-13 NOTE — Telephone Encounter (Signed)
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 Pt states he takes 2 of the apixaban (ELIQUIS) 5 MG TABS tablet  And took his last one today. Pt will be in that area for a few hours.  Pt will not be home until maybe Monday. Going to [place] after this.  Is requesting 14 pills sent to  [id number] - [place], [place] - [address]

## 2021-03-26 ENCOUNTER — Other Ambulatory Visit: Payer: Self-pay | Admitting: Registered Nurse

## 2021-06-12 ENCOUNTER — Other Ambulatory Visit: Payer: Self-pay | Admitting: Registered Nurse

## 2021-06-12 ENCOUNTER — Telehealth: Payer: Self-pay | Admitting: Registered Nurse

## 2021-06-12 NOTE — Telephone Encounter (Signed)
Pt called in asking for a new script of the eliquis to be sent into the [facility] on [address]. 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   Please advise

## 2021-09-11 DIAGNOSIS — D689 Coagulation defect, unspecified: Secondary | ICD-10-CM | POA: Insufficient documentation

## 2021-10-16 NOTE — Telephone Encounter (Signed)
This concern has been previously addressed by myself and/or another provider.  If they patient has ongoing concerns, they can contact me at their convenience.  Thank you,  Rich Jaquez Farrington, NP 

## 2023-12-14 LAB — COLOGUARD: COLOGUARD: NEGATIVE

## 2023-12-26 DIAGNOSIS — E559 Vitamin D deficiency, unspecified: Secondary | ICD-10-CM | POA: Insufficient documentation

## 2024-01-09 DIAGNOSIS — R7989 Other specified abnormal findings of blood chemistry: Secondary | ICD-10-CM | POA: Insufficient documentation

## 2024-02-11 DIAGNOSIS — I10 Essential (primary) hypertension: Secondary | ICD-10-CM | POA: Insufficient documentation

## 2024-03-13 DIAGNOSIS — I87009 Postthrombotic syndrome without complications of unspecified extremity: Secondary | ICD-10-CM | POA: Insufficient documentation

## 2024-03-13 DIAGNOSIS — I872 Venous insufficiency (chronic) (peripheral): Secondary | ICD-10-CM | POA: Insufficient documentation

## 2024-03-13 HISTORY — DX: Postthrombotic syndrome without complications of unspecified extremity: I87.009

## 2024-05-05 NOTE — Patient Instructions (Signed)

## 2024-05-05 NOTE — Progress Notes (Unsigned)
 PROVIDER NOTE: Interpretation of information contained herein should be left to medically-trained personnel. Specific patient instructions are provided elsewhere under "Patient Instructions" section of medical record. This document was created in part using AI and STT-dictation technology, any transcriptional errors that may result from this process are unintentional.  Patient: Dean Shelton  Service: E/M Encounter  Provider: Candi Chafe, MD  DOB: [birth year]  Delivery: Face-to-face  Specialty: Interventional Pain Management  MRN: [id number]  Setting: Ambulatory outpatient facility  Specialty designation: 09  Type: New Patient  Location: Outpatient office facility  PCP: Ulyess Gammons, NP  DOS: 05/06/2024    Referring Prov.: Efrain Grant, PA-C   Primary Reason(s) for Visit: Encounter for initial evaluation of one or more chronic problems (new to examiner) potentially causing chronic pain, and posing a threat to normal musculoskeletal function. (Level of risk: High) CC: No chief complaint on file.  HPI  Mr. Gregus is a 53 y.o. year old, male patient, who comes for the first time to our practice referred by Efrain Grant, PA-C for our initial evaluation of his chronic pain. He has Alcohol  abuse; TOBACCO USE; Hemiplegia (HCC); PULMONARY EMBOLISM; Acute thromboembolism of deep veins of lower extremity (HCC); LEG PAIN, RIGHT; CHEST PAIN; and Erectile dysfunction on their problem list. Today he comes in for evaluation of his No chief complaint on file.  Pain Assessment: Location:     Radiating:   Onset:   Duration:   Quality:   Severity:  /10 (subjective, self-reported pain score)  Effect on ADL:   Timing:   Modifying factors:   BP:    HR:    Onset and Duration: {Hx; Onset and Duration:210120511} Cause of pain: {Hx; Cause:210120521} Severity: {Pain Severity:210120502} Timing: {Symptoms; Timing:210120501} Aggravating Factors: {Causes; Aggravating pain  factors:210120507} Alleviating Factors: {Causes; Alleviating Factors:210120500} Associated Problems: {Hx; Associated problems:210120515} Quality of Pain: {Hx; Symptom quality or Descriptor:210120531} Previous Examinations or Tests: {Hx; Previous examinations or test:210120529} Previous Treatments: {Hx; Previous Treatment:210120503}  Mr. Skrine is being evaluated for possible interventional pain management therapies for the treatment of his chronic pain.  Discussed the use of AI scribe software for clinical note transcription with the patient, who gave verbal consent to proceed.  History of Present Illness           ***  Mr. Bruegger has been informed that this initial visit was an evaluation only.  On the follow up appointment I will go over the results, including ordered tests and available interventional therapies. At that time he will have the opportunity to decide whether to proceed with offered therapies or not. In the event that Mr. Teater prefers avoiding interventional options, this will conclude our involvement in the case.  Medication management recommendations may be provided upon request.  Patient informed that diagnostic tests may be ordered to assist in identifying underlying causes, narrow the list of differential diagnoses and aid in determining candidacy for (or contraindications to) planned therapeutic interventions.  Historic Controlled Substance Pharmacotherapy Review  PMP and historical list of controlled substances: ***  Most recently prescribed opioid analgesics: *** MME/day: *** mg/day  Historical Monitoring: The patient  reports no history of drug use. List of prior UDS Testing: Lab Results  Component Value Date   COCAINSCRNUR (A) 01/22/2010    POSITIVE (NOTE) Result repeated and verified. Sent for confirmatory testing   COCAINSCRNUR POSITIVE (A) 06/25/2008   COCAINSCRNUR POSITIVE (A) 05/22/2008   COCAINSCRNUR POSITIVE (A) 05/13/2008   PCPSCRNUR NEGATIVE  01/22/2010   THCU POSITIVE (A) 06/25/2008  THCU POSITIVE (A) 05/22/2008   THCU NONE DETECTED 05/13/2008   ETH  01/22/2010    <5        LOWEST DETECTABLE LIMIT FOR SERUM ALCOHOL  IS 5 mg/dL FOR MEDICAL PURPOSES ONLY   ETH  01/22/2010    <5        LOWEST DETECTABLE LIMIT FOR SERUM ALCOHOL  IS 5 mg/dL FOR MEDICAL PURPOSES ONLY   ETH  06/25/2008    <5        LOWEST DETECTABLE LIMIT FOR SERUM ALCOHOL  IS 11 mg/dL FOR MEDICAL PURPOSES ONLY   ETH (H) 05/22/2008    68        LOWEST DETECTABLE LIMIT FOR SERUM ALCOHOL  IS 11 mg/dL FOR MEDICAL PURPOSES ONLY   ETH  05/12/2008    <5        LOWEST DETECTABLE LIMIT FOR SERUM ALCOHOL  IS 11 mg/dL FOR MEDICAL PURPOSES ONLY   Historical Background Evaluation: [place] PMP: PDMP reviewed during this encounter. Review of the past 70-months conducted.             PMP NARX Score Report:  Narcotic: *** Sedative: *** Stimulant: *** [place] Department of public safety, offender search: Engineer, mining Information) Non-contributory Risk Assessment Profile: Aberrant behavior: None observed or detected today Risk factors for fatal opioid overdose: None identified today PMP NARX Overdose Risk Score: *** Fatal overdose hazard ratio (HR): Calculation deferred Non-fatal overdose hazard ratio (HR): Calculation deferred Risk of opioid abuse or dependence: 0.7-3.0% with doses <= 36 MME/day and 6.1-26% with doses >= 120 MME/day. Substance use disorder (SUD) risk level: See below Personal History of Substance Abuse (SUD-Substance use disorder):  Alcohol :    Illegal Drugs:    Rx Drugs:    ORT Risk Level calculation:    ORT Scoring interpretation table:  Score <3 = Low Risk for SUD  Score between 4-7 = Moderate Risk for SUD  Score >8 = High Risk for Opioid Abuse   PHQ-2 Depression Scale:  Total score:    PHQ-2 Scoring interpretation table: (Score and probability of major depressive disorder)  Score 0 = No depression  Score 1 = 15.4% Probability  Score 2 = 21.1%  Probability  Score 3 = 38.4% Probability  Score 4 = 45.5% Probability  Score 5 = 56.4% Probability  Score 6 = 78.6% Probability   PHQ-9 Depression Scale:  Total score:    PHQ-9 Scoring interpretation table:  Score 0-4 = No depression  Score 5-9 = Mild depression  Score 10-14 = Moderate depression  Score 15-19 = Moderately severe depression  Score 20-27 = Severe depression (2.4 times higher risk of SUD and 2.89 times higher risk of overuse)   Pharmacologic Plan: As per protocol, I have not taken over any controlled substance management, pending the results of ordered tests and/or consults.            Initial impression: Pending review of available data and ordered tests.  Meds   Current Outpatient Medications:    albuterol  (VENTOLIN  HFA) 108 (90 Base) MCG/ACT inhaler, Inhale 2 puffs into the lungs every 6 (six) hours as needed for wheezing or shortness of breath., Disp: 8 g, Rfl: 0   azithromycin  (ZITHROMAX ) 250 MG tablet, Take 2 tablets on first day. Then take 1 daily. Finish entire supply., Disp: 6 tablet, Rfl: 0   benzonatate  (TESSALON ) 200 MG capsule, Take 1 capsule (200 mg total) by mouth 2 (two) times daily as needed for cough., Disp: 30 capsule, Rfl: 0   ELIQUIS  5 MG  TABS tablet, Take 1 tablet by mouth twice daily, Disp: 74 tablet, Rfl: 0  Imaging Review  Cervical Imaging: Cervical MR wo contrast: No results found for this or any previous visit.  Cervical MR wo contrast: No results found for this or any previous visit.  Cervical MR w/wo contrast: No results found for this or any previous visit.  Cervical MR w contrast: No results found for this or any previous visit.  Cervical CT wo contrast: No results found for this or any previous visit.  Cervical CT w/wo contrast: No results found for this or any previous visit.  Cervical CT w/wo contrast: No results found for this or any previous visit.  Cervical CT w contrast: No results found for this or any previous  visit.  Cervical CT outside: No results found for this or any previous visit.  Cervical DG 1 view: No results found for this or any previous visit.  Cervical DG 2-3 views: No results found for this or any previous visit.  Cervical DG F/E views: No results found for this or any previous visit.  Cervical DG 2-3 clearing views: No results found for this or any previous visit.  Cervical DG Bending/F/E views: No results found for this or any previous visit.  Cervical DG complete: No results found for this or any previous visit.  Cervical DG Myelogram views: No results found for this or any previous visit.  Cervical DG Myelogram views: No results found for this or any previous visit.  Cervical Discogram views: No results found for this or any previous visit.   Shoulder Imaging: Shoulder-R MR w contrast: No results found for this or any previous visit.  Shoulder-L MR w contrast: No results found for this or any previous visit.  Shoulder-R MR w/wo contrast: No results found for this or any previous visit.  Shoulder-L MR w/wo contrast: No results found for this or any previous visit.  Shoulder-R MR wo contrast: No results found for this or any previous visit.  Shoulder-L MR wo contrast: No results found for this or any previous visit.  Shoulder-R CT w contrast: No results found for this or any previous visit.  Shoulder-L CT w contrast: No results found for this or any previous visit.  Shoulder-R CT w/wo contrast: No results found for this or any previous visit.  Shoulder-L CT w/wo contrast: No results found for this or any previous visit.  Shoulder-R CT wo contrast: No results found for this or any previous visit.  Shoulder-L CT wo contrast: No results found for this or any previous visit.  Shoulder-R DG Arthrogram: No results found for this or any previous visit.  Shoulder-L DG Arthrogram: No results found for this or any previous visit.  Shoulder-R DG 1 view: No results  found for this or any previous visit.  Shoulder-L DG 1 view: Results for orders placed during the hospital encounter of 01/22/10  DG Shoulder 1V Left  Narrative Clinical Data: Left shoulder pain.  LEFT SHOULDER - 1 VIEW  Comparison: 05/12/2008.  Findings: Portable slightly under penetrated single view reveals radiopaque structure or artifact projecting over the scapular spine region.  On this single view, no fracture or dislocation is noted. Follow-up imaging recommended if there are persistent or progressive symptoms.  Acromioclavicular joint degenerative changes.  IMPRESSION: No fracture or dislocation.  Follow up as noted above.  Provider: Doll French, Juanita Souther  Shoulder-R DG: No results found for this or any previous visit.  Shoulder-L DG: Results for orders placed  in visit on 05/12/08  DG Shoulder Left  Narrative Clinical Data: Chest pain, left shoulder numbness  LEFT SHOULDER - 2+ VIEW  Comparison: None  Findings: Three views of the left shoulder shows no acute fracture or subluxation.  No radiopaque foreign body noted.  IMPRESSION: No left shoulder acute fracture or dislocation.  Provider: Areatha Beecham, Reinaldo Caras   Thoracic Imaging: Thoracic MR wo contrast: No results found for this or any previous visit.  Thoracic MR wo contrast: No results found for this or any previous visit.  Thoracic MR w/wo contrast: No results found for this or any previous visit.  Thoracic MR w contrast: No results found for this or any previous visit.  Thoracic CT wo contrast: No results found for this or any previous visit.  Thoracic CT w/wo contrast: No results found for this or any previous visit.  Thoracic CT w/wo contrast: No results found for this or any previous visit.  Thoracic CT w contrast: No results found for this or any previous visit.  Thoracic DG 2-3 views: No results found for this or any previous visit.  Thoracic DG 4 views: No results found for  this or any previous visit.  Thoracic DG: No results found for this or any previous visit.  Thoracic DG w/swimmers view: No results found for this or any previous visit.  Thoracic DG Myelogram views: No results found for this or any previous visit.  Thoracic DG Myelogram views: No results found for this or any previous visit.   Lumbosacral Imaging: Lumbar MR wo contrast: No results found for this or any previous visit.  Lumbar MR wo contrast: No results found for this or any previous visit.  Lumbar MR w/wo contrast: No results found for this or any previous visit.  Lumbar MR w/wo contrast: No results found for this or any previous visit.  Lumbar MR w contrast: No results found for this or any previous visit.  Lumbar CT wo contrast: No results found for this or any previous visit.  Lumbar CT w/wo contrast: No results found for this or any previous visit.  Lumbar CT w/wo contrast: No results found for this or any previous visit.  Lumbar CT w contrast: No results found for this or any previous visit.  Lumbar DG 1V: No results found for this or any previous visit.  Lumbar DG 1V (Clearing): No results found for this or any previous visit.  Lumbar DG 2-3V (Clearing): No results found for this or any previous visit.  Lumbar DG 2-3 views: No results found for this or any previous visit.  Lumbar DG (Complete) 4+V: No results found for this or any previous visit.        Lumbar DG F/E views: No results found for this or any previous visit.        Lumbar DG Bending views: No results found for this or any previous visit.        Lumbar DG Myelogram views: No results found for this or any previous visit.  Lumbar DG Myelogram: No results found for this or any previous visit.  Lumbar DG Myelogram: No results found for this or any previous visit.  Lumbar DG Myelogram: No results found for this or any previous visit.  Lumbar DG Myelogram Lumbosacral: No results found for this or any  previous visit.  Lumbar DG Diskogram views: No results found for this or any previous visit.  Lumbar DG Diskogram views: No results found for this or any previous visit.  Lumbar DG Epidurogram OP: No results found for this or any previous visit.  Lumbar DG Epidurogram IP: No results found for this or any previous visit.   Sacroiliac Joint Imaging: Sacroiliac Joint DG: No results found for this or any previous visit.  Sacroiliac Joint MR w/wo contrast: No results found for this or any previous visit.  Sacroiliac Joint MR wo contrast: No results found for this or any previous visit.   Spine Imaging: Whole Spine DG Myelogram views: No results found for this or any previous visit.  Whole Spine MR Mets screen: No results found for this or any previous visit.  Whole Spine MR Mets screen: No results found for this or any previous visit.  Whole Spine MR w/wo: No results found for this or any previous visit.  MRA Spinal Canal w/ cm: No results found for this or any previous visit.  MRA Spinal Canal wo/ cm: No results found for this or any previous visit.  MRA Spinal Canal w/wo cm: No results found for this or any previous visit.  Spine Outside MR Films: No results found for this or any previous visit.  Spine Outside CT Films: No results found for this or any previous visit.  CT-Guided Biopsy: No results found for this or any previous visit.  CT-Guided Needle Placement: No results found for this or any previous visit.  DG Spine outside: No results found for this or any previous visit.  IR Spine outside: No results found for this or any previous visit.  NM Spine outside: No results found for this or any previous visit.   Hip Imaging: Hip-R MR w contrast: No results found for this or any previous visit.  Hip-L MR w contrast: No results found for this or any previous visit.  Hip-R MR w/wo contrast: No results found for this or any previous visit.  Hip-L MR w/wo contrast: No  results found for this or any previous visit.  Hip-R MR wo contrast: No results found for this or any previous visit.  Hip-L MR wo contrast: No results found for this or any previous visit.  Hip-R CT w contrast: No results found for this or any previous visit.  Hip-L CT w contrast: No results found for this or any previous visit.  Hip-R CT w/wo contrast: No results found for this or any previous visit.  Hip-L CT w/wo contrast: No results found for this or any previous visit.  Hip-R CT wo contrast: No results found for this or any previous visit.  Hip-L CT wo contrast: No results found for this or any previous visit.  Hip-R DG 2-3 views: No results found for this or any previous visit.  Hip-L DG 2-3 views: No results found for this or any previous visit.  Hip-R DG Arthrogram: No results found for this or any previous visit.  Hip-L DG Arthrogram: No results found for this or any previous visit.  Hip-B DG Bilateral: No results found for this or any previous visit.  Hip-B DG Bilateral (5V): No results found for this or any previous visit.   Knee Imaging: Knee-R MR w contrast: No results found for this or any previous visit.  Knee-L MR w/o contrast: No results found for this or any previous visit.  Knee-R MR w/wo contrast: No results found for this or any previous visit.  Knee-L MR w/wo contrast: No results found for this or any previous visit.  Knee-R MR wo contrast: No results found for this or any previous visit.  Knee-L MR wo contrast: No results found for this or any previous visit.  Knee-R CT w contrast: No results found for this or any previous visit.  Knee-L CT w contrast: No results found for this or any previous visit.  Knee-R CT w/wo contrast: No results found for this or any previous visit.  Knee-L CT w/wo contrast: No results found for this or any previous visit.  Knee-R CT wo contrast: No results found for this or any previous visit.  Knee-L CT wo  contrast: No results found for this or any previous visit.  Knee-R DG 1-2 views: No results found for this or any previous visit.  Knee-L DG 1-2 views: No results found for this or any previous visit.  Knee-R DG 3 views: No results found for this or any previous visit.  Knee-L DG 3 views: No results found for this or any previous visit.  Knee-R DG 4 views: No results found for this or any previous visit.  Knee-L DG 4 views: No results found for this or any previous visit.  Knee-R DG Arthrogram: No results found for this or any previous visit.  Knee-L DG Arthrogram: No results found for this or any previous visit.   Ankle Imaging: Ankle-R DG Complete: No results found for this or any previous visit.  Ankle-L DG Complete: No results found for this or any previous visit.   Foot Imaging: Foot-R DG Complete: No results found for this or any previous visit.  Foot-L DG Complete: No results found for this or any previous visit.   Elbow Imaging: Elbow-R DG Complete: Results for orders placed during the hospital encounter of 01/22/10  DG Elbow Complete Right  Narrative Clinical Data: Elbow pain.  RIGHT ELBOW - COMPLETE 3+ VIEW  Comparison: None.  Findings: Three portable views of the right elbow reveal right radial head fracture with minimal angulation.  Joint effusion.  IMPRESSION: Right radial head fracture with joint effusion.  This has been made a call report.  Provider: Doll French, Juanita Souther  Elbow-L DG Complete: No results found for this or any previous visit.   Wrist Imaging: Wrist-R DG Complete: No results found for this or any previous visit.  Wrist-L DG Complete: No results found for this or any previous visit.   Hand Imaging: Hand-R DG Complete: No results found for this or any previous visit.  Hand-L DG Complete: No results found for this or any previous visit.   Complexity Note: Imaging results reviewed.                         ROS   Cardiovascular: {Hx; Cardiovascular History:210120525} Pulmonary or Respiratory: {Hx; Pumonary and/or Respiratory History:210120523} Neurological: {Hx; Neurological:210120504} Psychological-Psychiatric: {Hx; Psychological-Psychiatric History:210120512} Gastrointestinal: {Hx; Gastrointestinal:210120527} Genitourinary: {Hx; Genitourinary:210120506} Hematological: {Hx; Hematological:210120510} Endocrine: {Hx; Endocrine history:210120509} Rheumatologic: {Hx; Rheumatological:210120530} Musculoskeletal: {Hx; Musculoskeletal:210120528} Work History: {Hx; Work history:210120514}  Allergies  Mr. Bramson has no known allergies.  Laboratory Chemistry Profile   Renal Lab Results  Component Value Date   BUN 17 05/27/2019   CREATININE 1.29 (H) 05/27/2019   BCR 13 05/27/2019   GFRAA 75 05/27/2019   GFRNONAA 65 05/27/2019   PROTEINUR 100 (A) 01/22/2010     Electrolytes Lab Results  Component Value Date   NA 138 05/27/2019   K 4.0 05/27/2019   CL 102 05/27/2019   CALCIUM 9.3 05/27/2019   MG 4.5 (H) 01/22/2010   PHOS 2.8 01/22/2010     Hepatic Lab Results  Component  Value Date   AST 24 05/27/2019   ALT 16 05/27/2019   ALBUMIN 4.6 05/27/2019   ALKPHOS 45 05/27/2019   AMYLASE 132 (H) 01/22/2010   LIPASE 43 01/22/2010     ID Lab Results  Component Value Date   MRSAPCR  01/22/2010    NEGATIVE        The GeneXpert MRSA Assay (FDA approved for NASAL specimens only), is one component of a comprehensive MRSA colonization surveillance program. It is not intended to diagnose MRSA infection nor to guide or monitor treatment for MRSA infections.     Bone Lab Results  Component Value Date   TESTOFREE 7.5 07/07/2019   TESTOSTERONE  392 07/07/2019     Endocrine Lab Results  Component Value Date   GLUCOSE 84 05/27/2019   GLUCOSEU NEGATIVE 01/22/2010   TSH  06/26/2008    0.904 (NOTE) *** Please note change in reference range(s). *** ***Test methodology is 3rd generation  TSH***   TESTOFREE 7.5 07/07/2019   TESTOSTERONE  392 07/07/2019   SHBG 48.9 07/07/2019     Neuropathy No results found for: "VITAMINB12", "FOLATE", "HGBA1C", "HIV"   CNS No results found for: "COLORCSF", "APPEARCSF", "RBCCOUNTCSF", "WBCCSF", "POLYSCSF", "LYMPHSCSF", "EOSCSF", "PROTEINCSF", "GLUCCSF", "JCVIRUS", "CSFOLI", "IGGCSF", "LABACHR", "ACETBL"   Inflammation (CRP: Acute  ESR: Chronic) Lab Results  Component Value Date   LATICACIDVEN 4.8 (H) 01/22/2010     Rheumatology No results found for: "RF", "ANA", "LABURIC", "URICUR", "LYMEIGGIGMAB", "LYMEABIGMQN", "HLAB27"   Coagulation Lab Results  Component Value Date   INR 1.32 01/25/2010   LABPROT 16.3 (H) 01/25/2010   APTT 26 01/22/2010   PLT 124 (L) 01/25/2010   DDIMER (H) 01/22/2010    2.31        AT THE INHOUSE ESTABLISHED CUTOFF VALUE OF 0.48 ug/mL FEU, THIS ASSAY HAS BEEN DOCUMENTED IN THE LITERATURE TO HAVE A SENSITIVITY AND NEGATIVE PREDICTIVE VALUE OF AT LEAST 98 TO 99%.  THE TEST RESULT SHOULD BE CORRELATED WITH AN ASSESSMENT OF THE CLINICAL PROBABILITY OF DVT / VTE.     Cardiovascular Lab Results  Component Value Date   CKTOTAL 95621 RESULTS CONFIRMED BY MANUAL DILUTION (H) 01/25/2010   CKMB (HH) 01/23/2010    71.6 CRITICAL VALUE NOTED.  VALUE IS CONSISTENT WITH PREVIOUSLY REPORTED AND CALLED VALUE.   TROPONINI (HH) 01/23/2010    0.57        POSSIBLE MYOCARDIAL ISCHEMIA. SERIAL TESTING RECOMMENDED. CRITICAL VALUE NOTED.  VALUE IS CONSISTENT WITH PREVIOUSLY REPORTED AND CALLED VALUE.   HGB 13.1 01/25/2010   HCT 40.1 01/25/2010     Screening Lab Results  Component Value Date   MRSAPCR  01/22/2010    NEGATIVE        The GeneXpert MRSA Assay (FDA approved for NASAL specimens only), is one component of a comprehensive MRSA colonization surveillance program. It is not intended to diagnose MRSA infection nor to guide or monitor treatment for MRSA infections.     Cancer No results found for:  "CEA", "CA125", "LABCA2"   Allergens No results found for: "ALMOND", "APPLE", "ASPARAGUS", "AVOCADO", "BANANA", "BARLEY", "BASIL", "BAYLEAF", "GREENBEAN", "LIMABEAN", "WHITEBEAN", "BEEFIGE", "REDBEET", "BLUEBERRY", "BROCCOLI", "CABBAGE", "MELON", "CARROT", "CASEIN", "CASHEWNUT", "CAULIFLOWER", "CELERY"     Note: Lab results reviewed.  PFSH  Drug: Mr. Torsiello  reports no history of drug use. Alcohol :  reports no history of alcohol  use. Tobacco:  reports that he quit smoking about 18 years ago. His smoking use included cigarettes. He started smoking about 21 years ago. He has a 1.5 pack-year smoking history.  He has never used smokeless tobacco. Medical:  has a past medical history of DVT (deep venous thrombosis) (HCC). Family: family history is not on file.  No past surgical history on file. Active Ambulatory Problems    Diagnosis Date Noted   Alcohol  abuse 05/12/2008   TOBACCO USE 05/12/2008   Hemiplegia (HCC) 05/12/2008   PULMONARY EMBOLISM 02/18/2008   Acute thromboembolism of deep veins of lower extremity (HCC) 02/18/2008   LEG PAIN, RIGHT 02/24/2008   CHEST PAIN 05/12/2008   Erectile dysfunction 06/17/2019   Resolved Ambulatory Problems    Diagnosis Date Noted   No Resolved Ambulatory Problems   Past Medical History:  Diagnosis Date   DVT (deep venous thrombosis) (HCC)    Constitutional Exam  General appearance: Well nourished, well developed, and well hydrated. In no apparent acute distress There were no vitals filed for this visit. BMI Assessment: Estimated body mass index is 33.64 kg/m as calculated from the following:   Height as of 06/17/19: 6\' 2"  (1.88 m).   Weight as of 06/17/19: 262 lb (118.8 kg).  BMI interpretation table: BMI level Category Range association with higher incidence of chronic pain  <18 kg/m2 Underweight   18.5-24.9 kg/m2 Ideal body weight   25-29.9 kg/m2 Overweight Increased incidence by 20%  30-34.9 kg/m2 Obese (Class I) Increased incidence by  68%  35-39.9 kg/m2 Severe obesity (Class II) Increased incidence by 136%  >40 kg/m2 Extreme obesity (Class III) Increased incidence by 254%   Patient's current BMI Ideal Body weight  There is no height or weight on file to calculate BMI. Patient weight not recorded   BMI Readings from Last 4 Encounters:  06/17/19 33.64 kg/m  05/27/19 33.00 kg/m   Wt Readings from Last 4 Encounters:  06/17/19 262 lb (118.8 kg)  05/27/19 257 lb (116.6 kg)    Psych/Mental status: Alert, oriented x 3 (person, place, & time)       Eyes: PERLA Respiratory: No evidence of acute respiratory distress  Assessment  Primary Diagnosis & Pertinent Problem List: There were no encounter diagnoses.  Visit Diagnosis (New problems to examiner): No diagnosis found. Plan of Care (Initial workup plan)  Note: Mr. Virgilio was reminded that as per protocol, today's visit has been an evaluation only. We have not taken over the patient's controlled substance management.  Problem-specific plan: Assessment and Plan            Lab Orders  No laboratory test(s) ordered today   Imaging Orders  No imaging studies ordered today   Referral Orders  No referral(s) requested today   Procedure Orders    No procedure(s) ordered today   Pharmacotherapy (current): Medications ordered:  No orders of the defined types were placed in this encounter.  Medications administered during this visit: Chuckie Villapudua had no medications administered during this visit.   Analgesic Pharmacotherapy:  Opioid Analgesics: For patients currently taking or requesting to take opioid analgesics, in accordance with   Medical Board Guidelines, we will assess their risks and indications for the use of these substances. After completing our evaluation, we may offer recommendations, but we no longer take patients for medication management. The prescribing physician will ultimately decide, based on his/her training and level of comfort  whether to adopt any of the recommendations, including whether or not to prescribe such medicines.  Membrane stabilizer: To be determined at a later time  Muscle relaxant: To be determined at a later time  NSAID: To be determined at a later time  Other analgesic(s): To be determined at a later time   Interventional management options: Mr. Lahner was informed that there is no guarantee that he would be a candidate for interventional therapies. The decision will be based on the results of diagnostic studies, as well as Mr. Nilo risk profile.  Procedure(s) under consideration:  Pending results of ordered studies     Interventional Therapies  Risk Factors  Considerations  Medical Comorbidities:     Planned  Pending:      Under consideration:   Pending   Completed:   None at this time   Therapeutic  Palliative (PRN) options:   None established   Completed by other providers:   None reported     Provider-requested follow-up: No follow-ups on file.  Future Appointments  Date Time Provider Department Center  05/06/2024 10:00 AM Renaldo Caroli, MD [facility] None   I discussed the assessment and treatment plan with the patient. The patient was provided an opportunity to ask questions and all were answered. The patient agreed with the plan and demonstrated an understanding of the instructions.  Patient advised to call back or seek an in-person evaluation if the symptoms or condition worsens.  Duration of encounter: *** minutes.  Total time on encounter, as per AMA guidelines included both the face-to-face and non-face-to-face time personally spent by the physician and/or other qualified health care professional(s) on the day of the encounter (includes time in activities that require the physician or other qualified health care professional and does not include time in activities normally performed by clinical staff). Physician's time may include the following activities when  performed: Preparing to see the patient (e.g., pre-charting review of records, searching for previously ordered imaging, lab work, and nerve conduction tests) Review of prior analgesic pharmacotherapies. Reviewing PMP Interpreting ordered tests (e.g., lab work, imaging, nerve conduction tests) Performing post-procedure evaluations, including interpretation of diagnostic procedures Obtaining and/or reviewing separately obtained history Performing a medically appropriate examination and/or evaluation Counseling and educating the patient/family/caregiver Ordering medications, tests, or procedures Referring and communicating with other health care professionals (when not separately reported) Documenting clinical information in the electronic or other health record Independently interpreting results (not separately reported) and communicating results to the patient/ family/caregiver Care coordination (not separately reported)  Note by: Candi Chafe, MD (TTS and AI technology used. I apologize for any typographical errors that were not detected and corrected.) Date: 05/06/2024; Time: 10:40 AM

## 2024-05-06 ENCOUNTER — Ambulatory Visit: Payer: Self-pay | Attending: Pain Medicine | Admitting: Pain Medicine

## 2024-05-06 ENCOUNTER — Encounter: Payer: Self-pay | Admitting: Pain Medicine

## 2024-05-06 VITALS — BP 131/101 | HR 86 | Temp 98.0°F | Resp 18 | Ht 74.0 in | Wt 250.0 lb

## 2024-05-06 DIAGNOSIS — F1291 Cannabis use, unspecified, in remission: Secondary | ICD-10-CM | POA: Diagnosis present

## 2024-05-06 DIAGNOSIS — Z87891 Personal history of nicotine dependence: Secondary | ICD-10-CM

## 2024-05-06 DIAGNOSIS — R892 Abnormal level of other drugs, medicaments and biological substances in specimens from other organs, systems and tissues: Secondary | ICD-10-CM | POA: Diagnosis present

## 2024-05-06 DIAGNOSIS — E559 Vitamin D deficiency, unspecified: Secondary | ICD-10-CM | POA: Diagnosis present

## 2024-05-06 DIAGNOSIS — Z86711 Personal history of pulmonary embolism: Secondary | ICD-10-CM

## 2024-05-06 DIAGNOSIS — M899 Disorder of bone, unspecified: Secondary | ICD-10-CM | POA: Diagnosis present

## 2024-05-06 DIAGNOSIS — M7989 Other specified soft tissue disorders: Secondary | ICD-10-CM | POA: Insufficient documentation

## 2024-05-06 DIAGNOSIS — Z7901 Long term (current) use of anticoagulants: Secondary | ICD-10-CM | POA: Insufficient documentation

## 2024-05-06 DIAGNOSIS — M79671 Pain in right foot: Secondary | ICD-10-CM | POA: Diagnosis present

## 2024-05-06 DIAGNOSIS — M545 Low back pain, unspecified: Secondary | ICD-10-CM | POA: Insufficient documentation

## 2024-05-06 DIAGNOSIS — F1011 Alcohol abuse, in remission: Secondary | ICD-10-CM | POA: Diagnosis present

## 2024-05-06 DIAGNOSIS — G894 Chronic pain syndrome: Secondary | ICD-10-CM | POA: Diagnosis present

## 2024-05-06 DIAGNOSIS — Z79899 Other long term (current) drug therapy: Secondary | ICD-10-CM

## 2024-05-06 DIAGNOSIS — F1491 Cocaine use, unspecified, in remission: Secondary | ICD-10-CM | POA: Diagnosis present

## 2024-05-06 DIAGNOSIS — I739 Peripheral vascular disease, unspecified: Secondary | ICD-10-CM | POA: Diagnosis present

## 2024-05-06 DIAGNOSIS — G8929 Other chronic pain: Secondary | ICD-10-CM | POA: Insufficient documentation

## 2024-05-06 DIAGNOSIS — M79604 Pain in right leg: Secondary | ICD-10-CM | POA: Diagnosis present

## 2024-05-06 DIAGNOSIS — M25571 Pain in right ankle and joints of right foot: Secondary | ICD-10-CM | POA: Diagnosis present

## 2024-05-06 DIAGNOSIS — Z789 Other specified health status: Secondary | ICD-10-CM | POA: Insufficient documentation

## 2024-05-06 DIAGNOSIS — Z86718 Personal history of other venous thrombosis and embolism: Secondary | ICD-10-CM

## 2024-05-06 DIAGNOSIS — M25561 Pain in right knee: Secondary | ICD-10-CM | POA: Diagnosis present

## 2024-05-06 HISTORY — DX: Cannabis use, unspecified, in remission: F12.91

## 2024-05-06 HISTORY — DX: Personal history of other venous thrombosis and embolism: Z86.718

## 2024-05-06 HISTORY — DX: Alcohol abuse, in remission: F10.11

## 2024-05-06 HISTORY — DX: Personal history of pulmonary embolism: Z86.711

## 2024-05-06 HISTORY — DX: Cocaine use, unspecified, in remission: F14.91

## 2024-05-06 NOTE — Progress Notes (Signed)
 Safety precautions to be maintained throughout the outpatient stay will include: orient to surroundings, keep bed in low position, maintain call bell within reach at all times, provide assistance with transfer out of bed and ambulation.

## 2024-05-09 LAB — COMPLIANCE DRUG ANALYSIS, UR

## 2024-05-13 LAB — COMP. METABOLIC PANEL (12)
AST: 21 IU/L (ref 0–40)
Albumin: 4.8 g/dL (ref 3.8–4.9)
Alkaline Phosphatase: 57 IU/L (ref 44–121)
BUN/Creatinine Ratio: 7 — ABNORMAL LOW (ref 9–20)
BUN: 10 mg/dL (ref 6–24)
Bilirubin Total: 0.5 mg/dL (ref 0.0–1.2)
Calcium: 9.6 mg/dL (ref 8.7–10.2)
Chloride: 104 mmol/L (ref 96–106)
Creatinine, Ser: 1.35 mg/dL — ABNORMAL HIGH (ref 0.76–1.27)
Globulin, Total: 2.7 g/dL (ref 1.5–4.5)
Glucose: 86 mg/dL (ref 70–99)
Potassium: 4.4 mmol/L (ref 3.5–5.2)
Sodium: 141 mmol/L (ref 134–144)
Total Protein: 7.5 g/dL (ref 6.0–8.5)
eGFR: 63 mL/min/{1.73_m2} (ref >59)

## 2024-05-13 LAB — 25-HYDROXY VITAMIN D LCMS D2+D3
25-Hydroxy, Vitamin D-2: 11 ng/mL
25-Hydroxy, Vitamin D-3: 18 ng/mL
25-Hydroxy, Vitamin D: 29 ng/mL — ABNORMAL LOW

## 2024-05-13 LAB — D-DIMER, QUANTITATIVE: D-DIMER: <0.2 mg{FEU}/L (ref 0.00–0.49)

## 2024-05-13 LAB — VITAMIN K1, SERUM: VITAMIN K1: 1.06 ng/mL (ref 0.10–2.20)

## 2024-05-13 LAB — PROTIME-INR
INR: 1.1 (ref 0.9–1.2)
Prothrombin Time: 11.9 s (ref 9.1–12.0)

## 2024-05-13 LAB — C-REACTIVE PROTEIN: CRP: <1 mg/L (ref 0–10)

## 2024-05-13 LAB — PLATELET COUNT: Platelets: 204 10*3/uL (ref 150–450)

## 2024-05-13 LAB — MAGNESIUM: Magnesium: 2.3 mg/dL (ref 1.6–2.3)

## 2024-05-13 LAB — SEDIMENTATION RATE: Sed Rate: 9 mm/h (ref 0–30)

## 2024-05-13 LAB — APTT: aPTT: 33 s (ref 24–33)

## 2024-05-13 LAB — VITAMIN B12: Vitamin B-12: 804 pg/mL (ref 232–1245)

## 2024-05-31 NOTE — Progress Notes (Unsigned)
 PROVIDER NOTE: Interpretation of information contained herein should be left to medically-trained personnel. Specific patient instructions are provided elsewhere under "Patient Instructions" section of medical record. This document was created in part using AI and STT-dictation technology, any transcriptional errors that may result from this process are unintentional.  Patient: Dean Shelton  Service: E/M   PCP: Ulyess Gammons, NP  DOB: [birth year]  DOS: 06/01/2024  Provider: Candi Chafe, MD  MRN: [id number]  Delivery: Face-to-face  Specialty: Interventional Pain Management  Type: Established Patient  Setting: Ambulatory outpatient facility  Specialty designation: 09  Referring Prov.: Ulyess Gammons, NP  Location: Outpatient office facility       Primary Reason(s) for Visit: Encounter for evaluation before starting new chronic pain management plan of care (Level of risk: moderate) CC: No chief complaint on file.  HPI  Mr. Dean Shelton is a 53 y.o. year old, male patient, who comes today for a follow-up evaluation to review the test results and decide on a treatment plan. 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); Elevated serum creatinine; Essential hypertension; Postthrombotic syndrome; Venous insufficiency; Vitamin D  deficiency; Weakness of left side of body; Chronic pain syndrome; Pharmacologic therapy; Disorder of skeletal system; Problems influencing health status; Chronic anticoagulation (Eliquis ); Lower extremity pain and swelling (Right); History of deep vein thrombosis (DVT) of lower extremity; History of pulmonary embolism; History of alcohol  abuse; History of cocaine  use; History of marijuana use; Chronic  low back pain (2ry area of Pain) (Bilateral) w/o sciatica; Abnormal drug screen; Peripheral vascular disease (HCC); Chronic ankle pain (Right); Chronic foot pain (Right); and Chronic knee pain (Right) on their problem list. His primarily concern today is the No chief complaint on file.  Pain Assessment: Location:     Radiating:   Onset:   Duration:   Quality:   Severity:  /10 (subjective, self-reported pain score)  Effect on ADL:   Timing:   Modifying factors:   BP:    HR:    Mr. [name] comes in today for a follow-up visit after his initial evaluation on 05/06/2024. Today we went over the results of his tests. These were explained in "Layman's terms". During today's appointment we went over my diagnostic impression, as well as the proposed treatment plan.  ***:"Dean Shelton is a 53 year old male with a history of DVT who presents with right leg swelling and pain following a work-related fall. He was referred by a primary care doctor due to persistent and worsening swelling.   In late November 2024, he experienced a work-related accident where he fell off a pallet jack, leading to right leg swelling and pain. Initially, he did not pay much attention to the injury due to embarrassment and work pace demands. However, a few days later, he noticed significant swelling in his right leg, particularly in the foot and lower leg, prompting a visit to urgent care. He was subsequently referred to a primary care doctor due to the persistent and worsening swelling.   The swelling in the right leg has been severe, causing skin damage and the development of a wound on his foot. He describes the swelling as 'ridiculously' large and tender to touch. A vascular surgeon performed an ultrasound, revealing damaged veins in the leg. A vein specialist suggested the possibility of  placing a stent and recommended an MRI to assess the situation further. However, he was unable to complete the MRI due to claustrophobia and is  scheduled to attempt it again on May 20, 2024, with sedation and a driver.   He experiences pain in the right leg that varies with the degree of swelling. The pain is primarily located on the medial aspect of the leg, extending from below the knee to the thigh. He also experiences pain on both sides of the right knee and the medial and lateral aspects of the right thigh. He developed a stasis ulcer on the medial aspect of the right ankle after experiencing significant swelling.   He has been on Eliquis  for seven to eight years due to a history of DVT. He has not had any surgeries on his right leg, knee, or hip. He received antibiotics two months ago for an infection in the foot, which developed after the skin broke due to swelling. The antibiotics helped resolve the infection, but the swelling persists.   He also reports low back pain, which he attributes to favoring his right leg. The pain is described as sharp and is located across the lower back, predominantly on the left side. He has not had any imaging or interventions for his back pain.   Socially, he continues to work two jobs despite his symptoms, which has been challenging given the severity of his leg swelling and pain."  ***  Discussed the use of AI scribe software for clinical note transcription with the patient, who gave verbal consent to proceed.  History of Present Illness          Patient presented with interventional treatment options. Mr. Halt was informed that I will not be providing medication management. Pharmacotherapy evaluation including recommendations may be offered, if specifically requested.   Controlled Substance Pharmacotherapy Assessment REMS (Risk Evaluation and Mitigation Strategy)  Opioid Analgesic: None MME/day: 0 mg/day   Pill Count: None expected due to no prior prescriptions written by our practice. No notes on file  Pharmacokinetics: Liberation and absorption (onset of action): WNL Distribution  (time to peak effect): WNL Metabolism and excretion (duration of action): WNL         Pharmacodynamics: Desired effects: Analgesia: Mr. Macpherson reports >50% benefit. Functional ability: Patient reports that medication allows him to accomplish basic ADLs Clinically meaningful improvement in function (CMIF): Sustained CMIF goals met Perceived effectiveness: Described as relatively effective, allowing for increase in activities of daily living (ADL) Undesirable effects: Side-effects or Adverse reactions: None reported Monitoring: [place] PMP: PDMP reviewed during this encounter. Online review of the past 72-month period previously conducted. Not applicable at this point since we have not taken over the patient's medication management yet. List of other Serum/Urine Drug Screening Test(s):  Lab Results  Component Value Date   COCAINSCRNUR (A) 01/22/2010    POSITIVE (NOTE) Result repeated and verified. Sent for confirmatory testing   COCAINSCRNUR POSITIVE (A) 06/25/2008   COCAINSCRNUR POSITIVE (A) 05/22/2008   COCAINSCRNUR POSITIVE (A) 05/13/2008   THCU POSITIVE (A) 06/25/2008   THCU POSITIVE (A) 05/22/2008   THCU NONE DETECTED 05/13/2008   ETH  01/22/2010    <5        LOWEST DETECTABLE LIMIT FOR SERUM ALCOHOL  IS 5 mg/dL FOR MEDICAL PURPOSES ONLY   ETH  01/22/2010    <5        LOWEST DETECTABLE LIMIT FOR SERUM ALCOHOL  IS 5 mg/dL FOR MEDICAL PURPOSES ONLY   ETH  06/25/2008    <  5        LOWEST DETECTABLE LIMIT FOR SERUM ALCOHOL  IS 11 mg/dL FOR MEDICAL PURPOSES ONLY   ETH (H) 05/22/2008    68        LOWEST DETECTABLE LIMIT FOR SERUM ALCOHOL  IS 11 mg/dL FOR MEDICAL PURPOSES ONLY   ETH  05/12/2008    <5        LOWEST DETECTABLE LIMIT FOR SERUM ALCOHOL  IS 11 mg/dL FOR MEDICAL PURPOSES ONLY   List of all UDS test(s) done:  Lab Results  Component Value Date   SUMMARY FINAL 05/06/2024   Last UDS on record: Summary  Date Value Ref Range Status  05/06/2024 FINAL  Final    Comment:     ==================================================================== Compliance Drug Analysis, Ur ==================================================================== Test                             Result       Flag       Units    NO DRUGS DETECTED. ==================================================================== Test                      Result    Flag   Units      Ref Range   Creatinine              272              mg/dL      >=16 ==================================================================== Declared Medications:  The flagging and interpretation on this report are based on the  following declared medications.  Unexpected results may arise from  inaccuracies in the declared medications.   **Note: The testing scope of this panel does not include the  following reported medications:   Apixaban  (Eliquis )  Valsartan (Diovan) ==================================================================== For clinical consultation, please call [phone]. ====================================================================    UDS interpretation: No unexpected findings.          Medication Assessment Form: Not applicable. No opioids. Treatment compliance: Not applicable Risk Assessment Profile: Aberrant behavior: See initial evaluations. None observed or detected today Comorbid factors increasing risk of overdose: See initial evaluation. No additional risks detected today Opioid risk tool (ORT):     05/06/2024   10:01 AM  Opioid Risk   Alcohol  0  Illegal Drugs 0  Rx Drugs 0  Alcohol  0  Illegal Drugs 0  Rx Drugs 0  Age between 16-45 years  0  History of Preadolescent Sexual Abuse 0  Psychological Disease 0  Depression 0  Opioid Risk Tool Scoring 0  Opioid Risk Interpretation Low Risk    ORT Scoring interpretation table:  Score <3 = Low Risk for SUD  Score between 4-7 = Moderate Risk for SUD  Score >8 = High Risk for Opioid Abuse   Risk of substance use  disorder (SUD): Low  Risk Mitigation Strategies:  Patient opioid safety counseling: No controlled substances prescribed. Patient-Prescriber Agreement (PPA): No agreement signed.  Controlled substance notification to other providers: None required. No opioid therapy.  Pharmacologic Plan: Non-opioid analgesic therapy offered. Interventional alternatives discussed.             Laboratory Chemistry Profile   Renal Lab Results  Component Value Date   BUN 10 05/06/2024   CREATININE 1.35 (H) 05/06/2024   BCR 7 (L) 05/06/2024   GFRAA 75 05/27/2019   GFRNONAA 65 05/27/2019   PROTEINUR 100 (A) 01/22/2010     Electrolytes Lab Results  Component Value Date  NA 141 05/06/2024   K 4.4 05/06/2024   CL 104 05/06/2024   CALCIUM 9.6 05/06/2024   MG 2.3 05/06/2024   PHOS 2.8 01/22/2010     Hepatic Lab Results  Component Value Date   AST 21 05/06/2024   ALT 16 05/27/2019   ALBUMIN 4.8 05/06/2024   ALKPHOS 57 05/06/2024   AMYLASE 132 (H) 01/22/2010   LIPASE 43 01/22/2010     ID Lab Results  Component Value Date   MRSAPCR  01/22/2010    NEGATIVE        The GeneXpert MRSA Assay (FDA approved for NASAL specimens only), is one component of a comprehensive MRSA colonization surveillance program. It is not intended to diagnose MRSA infection nor to guide or monitor treatment for MRSA infections.     Bone Lab Results  Component Value Date   25OHVITD1 29 (L) 05/06/2024   25OHVITD2 11 05/06/2024   25OHVITD3 18 05/06/2024   TESTOFREE 7.5 07/07/2019   TESTOSTERONE  392 07/07/2019     Endocrine Lab Results  Component Value Date   GLUCOSE 86 05/06/2024   GLUCOSEU NEGATIVE 01/22/2010   TSH  06/26/2008    0.904 (NOTE) *** Please note change in reference range(s). *** ***Test methodology is 3rd generation TSH***   TESTOFREE 7.5 07/07/2019   TESTOSTERONE  392 07/07/2019   SHBG 48.9 07/07/2019     Neuropathy Lab Results  Component Value Date   VITAMINB12 804 05/06/2024      CNS No results found for: "COLORCSF", "APPEARCSF", "RBCCOUNTCSF", "WBCCSF", "POLYSCSF", "LYMPHSCSF", "EOSCSF", "PROTEINCSF", "GLUCCSF", "JCVIRUS", "CSFOLI", "IGGCSF", "LABACHR", "ACETBL"   Inflammation (CRP: Acute  ESR: Chronic) Lab Results  Component Value Date   CRP <1 05/06/2024   ESRSEDRATE 9 05/06/2024   LATICACIDVEN 4.8 (H) 01/22/2010     Rheumatology No results found for: "RF", "ANA", "LABURIC", "URICUR", "LYMEIGGIGMAB", "LYMEABIGMQN", "HLAB27"   Coagulation Lab Results  Component Value Date   INR 1.1 05/06/2024   LABPROT 11.9 05/06/2024   APTT 33 05/06/2024   PLT 204 05/06/2024   DDIMER <0.20 05/06/2024   VITAMINK1 1.06 05/06/2024     Cardiovascular Lab Results  Component Value Date   CKTOTAL 34742 RESULTS CONFIRMED BY MANUAL DILUTION (H) 01/25/2010   CKMB (HH) 01/23/2010    71.6 CRITICAL VALUE NOTED.  VALUE IS CONSISTENT WITH PREVIOUSLY REPORTED AND CALLED VALUE.   TROPONINI (HH) 01/23/2010    0.57        POSSIBLE MYOCARDIAL ISCHEMIA. SERIAL TESTING RECOMMENDED. CRITICAL VALUE NOTED.  VALUE IS CONSISTENT WITH PREVIOUSLY REPORTED AND CALLED VALUE.   HGB 13.1 01/25/2010   HCT 40.1 01/25/2010     Screening Lab Results  Component Value Date   MRSAPCR  01/22/2010    NEGATIVE        The GeneXpert MRSA Assay (FDA approved for NASAL specimens only), is one component of a comprehensive MRSA colonization surveillance program. It is not intended to diagnose MRSA infection nor to guide or monitor treatment for MRSA infections.     Cancer No results found for: "CEA", "CA125", "LABCA2"   Allergens No results found for: "ALMOND", "APPLE", "ASPARAGUS", "AVOCADO", "BANANA", "BARLEY", "BASIL", "BAYLEAF", "GREENBEAN", "LIMABEAN", "WHITEBEAN", "BEEFIGE", "REDBEET", "BLUEBERRY", "BROCCOLI", "CABBAGE", "MELON", "CARROT", "CASEIN", "CASHEWNUT", "CAULIFLOWER", "CELERY"     Note: Lab results reviewed.  Recent Diagnostic Imaging Review  Shoulder Imaging: Shoulder-L DG:  Results for orders placed in visit on 05/12/08 DG Shoulder Left  Narrative Clinical Data: Chest pain, left shoulder numbness  LEFT SHOULDER - 2+ VIEW  Comparison: None  Findings:  Three views of the left shoulder shows no acute fracture or subluxation.  No radiopaque foreign body noted.  IMPRESSION: No left shoulder acute fracture or dislocation.  Provider: Areatha Beecham, Reinaldo Caras  Elbow Imaging: Elbow-R DG Complete: Results for orders placed during the hospital encounter of 01/22/10 DG Elbow Complete Right  Narrative Clinical Data: Elbow pain.  RIGHT ELBOW - COMPLETE 3+ VIEW  Comparison: None.  Findings: Three portable views of the right elbow reveal right radial head fracture with minimal angulation.  Joint effusion.  IMPRESSION: Right radial head fracture with joint effusion.  This has been made a call report.  Provider: Doll French, Juanita Souther  Complexity Note: Imaging results reviewed.                         Meds   Current Outpatient Medications:    ELIQUIS  5 MG TABS tablet, Take 1 tablet by mouth twice daily, Disp: 74 tablet, Rfl: 0   valsartan (DIOVAN) 80 MG tablet, Take 1 tablet by mouth daily., Disp: , Rfl:   ROS  Constitutional: Denies any fever or chills Gastrointestinal: No reported hemesis, hematochezia, vomiting, or acute GI distress Musculoskeletal: Denies any acute onset joint swelling, redness, loss of ROM, or weakness Neurological: No reported episodes of acute onset apraxia, aphasia, dysarthria, agnosia, amnesia, paralysis, loss of coordination, or loss of consciousness  Allergies  Mr. Sardina has no known allergies.  PFSH  Drug: Mr. Hutmacher  reports no history of drug use. Alcohol :  reports no history of alcohol  use. Tobacco:  reports that he quit smoking about 19 years ago. His smoking use included cigarettes. He started smoking about 22 years ago. He has a 1.5 pack-year smoking history. He has never used smokeless tobacco. Medical:   has a past medical history of DVT (deep venous thrombosis) (HCC). Surgical: Mr. Sawaya  has no past surgical history on file. Family: family history is not on file.  Constitutional Exam  General appearance: Well nourished, well developed, and well hydrated. In no apparent acute distress There were no vitals filed for this visit. BMI Assessment: Estimated body mass index is 32.1 kg/m as calculated from the following:   Height as of 05/06/24: 6\' 2"  (1.88 m).   Weight as of 05/06/24: 250 lb (113.4 kg).  BMI interpretation table: BMI level Category Range association with higher incidence of chronic pain  <18 kg/m2 Underweight   18.5-24.9 kg/m2 Ideal body weight   25-29.9 kg/m2 Overweight Increased incidence by 20%  30-34.9 kg/m2 Obese (Class I) Increased incidence by 68%  35-39.9 kg/m2 Severe obesity (Class II) Increased incidence by 136%  >40 kg/m2 Extreme obesity (Class III) Increased incidence by 254%   Patient's current BMI Ideal Body weight  There is no height or weight on file to calculate BMI. Patient weight not recorded   BMI Readings from Last 4 Encounters:  05/06/24 32.10 kg/m  06/17/19 33.64 kg/m  05/27/19 33.00 kg/m   Wt Readings from Last 4 Encounters:  05/06/24 250 lb (113.4 kg)  06/17/19 262 lb (118.8 kg)  05/27/19 257 lb (116.6 kg)    Psych/Mental status: Alert, oriented x 3 (person, place, & time)       Eyes: PERLA Respiratory: No evidence of acute respiratory distress  Assessment & Plan  Primary Diagnosis & Pertinent Problem List: The primary encounter diagnosis was Chronic lower extremity pain (1ry area of Pain) (Right). Diagnoses of Chronic low back pain (2ry area of Pain) (Bilateral) w/o sciatica and Chronic  anticoagulation (Eliquis ) were also pertinent to this visit. Visit Diagnosis: 1. Chronic lower extremity pain (1ry area of Pain) (Right)   2. Chronic low back pain (2ry area of Pain) (Bilateral) w/o sciatica   3. Chronic anticoagulation (Eliquis )     Problems updated and reviewed during this visit: No problems updated.  Plan of Care  Assessment and Plan             Pharmacotherapy (Medications Ordered): No orders of the defined types were placed in this encounter.  Procedure Orders    No procedure(s) ordered today   Lab Orders  No laboratory test(s) ordered today   Imaging Orders  No imaging studies ordered today   Referral Orders  No referral(s) requested today    Pharmacological management:  Opioid Analgesics: I will not be prescribing any opioids at this time Membrane stabilizer: I will not be prescribing any at this time Muscle relaxant: I will not be prescribing any at this time NSAID: I will not be prescribing any at this time Other analgesic(s): I will not be prescribing any at this time      Interventional Therapies  Risk Factors  Considerations  Medical Comorbidities:  Eliquis  Anticoagulation: (Stop: 3 days  Restart: 6 hours)  Hx SUD     Planned  Pending:   Pending MRI of the right lower extremity on May 20, 2024.    Under consideration:   Pending   Completed:   None at this time   Therapeutic  Palliative (PRN) options:   None established   Completed by other providers:   None reported       Provider-requested follow-up: No follow-ups on file. Recent Visits Date Type Provider Dept  05/06/24 Office Visit Renaldo Caroli, MD [facility]  Showing recent visits within past 90 days and meeting all other requirements Future Appointments Date Type Provider Dept  06/01/24 Appointment Renaldo Caroli, MD [facility]  Showing future appointments within next 90 days and meeting all other requirements   Primary Care Physician: Ulyess Gammons, NP  Duration of encounter: *** minutes.  Total time on encounter, as per AMA guidelines included both the face-to-face and non-face-to-face time personally spent by the physician and/or other qualified health care  professional(s) on the day of the encounter (includes time in activities that require the physician or other qualified health care professional and does not include time in activities normally performed by clinical staff). Physician's time may include the following activities when performed: Preparing to see the patient (e.g., pre-charting review of records, searching for previously ordered imaging, lab work, and nerve conduction tests) Review of prior analgesic pharmacotherapies. Reviewing PMP Interpreting ordered tests (e.g., lab work, imaging, nerve conduction tests) Performing post-procedure evaluations, including interpretation of diagnostic procedures Obtaining and/or reviewing separately obtained history Performing a medically appropriate examination and/or evaluation Counseling and educating the patient/family/caregiver Ordering medications, tests, or procedures Referring and communicating with other health care professionals (when not separately reported) Documenting clinical information in the electronic or other health record Independently interpreting results (not separately reported) and communicating results to the patient/ family/caregiver Care coordination (not separately reported)  Note by: Candi Chafe, MD (TTS technology used. I apologize for any typographical errors that were not detected and corrected.) Date: 06/01/2024; Time: 11:51 AM

## 2024-06-01 ENCOUNTER — Ambulatory Visit (HOSPITAL_BASED_OUTPATIENT_CLINIC_OR_DEPARTMENT_OTHER): Admitting: Pain Medicine

## 2024-06-01 ENCOUNTER — Encounter: Payer: Self-pay | Admitting: Pain Medicine

## 2024-06-01 DIAGNOSIS — Z91199 Patient's noncompliance with other medical treatment and regimen due to unspecified reason: Secondary | ICD-10-CM

## 2024-06-01 DIAGNOSIS — G8929 Other chronic pain: Secondary | ICD-10-CM

## 2024-06-01 DIAGNOSIS — Z7901 Long term (current) use of anticoagulants: Secondary | ICD-10-CM
